# Patient Record
Sex: Female | Born: 1955
Health system: Southern US, Community
[De-identification: ages and names within clinical notes are randomized; demographics above are authoritative.]

## PROBLEM LIST (undated history)

## (undated) DIAGNOSIS — J189 Pneumonia, unspecified organism: Secondary | ICD-10-CM

## (undated) DIAGNOSIS — M1990 Unspecified osteoarthritis, unspecified site: Secondary | ICD-10-CM

## (undated) HISTORY — PX: AUGMENTATION MAMMAPLASTY: SUR837

## (undated) HISTORY — PX: ABDOMINAL HYSTERECTOMY: SHX81

## (undated) HISTORY — PX: PELVIC LAPAROSCOPY: SHX162

## (undated) HISTORY — DX: Unspecified osteoarthritis, unspecified site: M19.90

## (undated) HISTORY — PX: KNEE SURGERY: SHX244

## (undated) HISTORY — PX: ELBOW SURGERY: SHX618

---

## 1999-03-23 ENCOUNTER — Other Ambulatory Visit: Admission: RE | Admit: 1999-03-23 | Discharge: 1999-03-23 | Payer: Self-pay | Admitting: Gynecology

## 2000-08-31 ENCOUNTER — Other Ambulatory Visit: Admission: RE | Admit: 2000-08-31 | Discharge: 2000-08-31 | Payer: Self-pay | Admitting: Gynecology

## 2000-09-28 ENCOUNTER — Encounter: Payer: Self-pay | Admitting: Gynecology

## 2000-09-28 ENCOUNTER — Encounter: Admission: RE | Admit: 2000-09-28 | Discharge: 2000-09-28 | Payer: Self-pay | Admitting: Gynecology

## 2002-01-18 ENCOUNTER — Other Ambulatory Visit: Admission: RE | Admit: 2002-01-18 | Discharge: 2002-01-18 | Payer: Self-pay | Admitting: Gynecology

## 2002-01-19 ENCOUNTER — Encounter: Payer: Self-pay | Admitting: Gynecology

## 2002-01-19 ENCOUNTER — Encounter: Admission: RE | Admit: 2002-01-19 | Discharge: 2002-01-19 | Payer: Self-pay | Admitting: Gynecology

## 2002-09-03 ENCOUNTER — Ambulatory Visit (HOSPITAL_BASED_OUTPATIENT_CLINIC_OR_DEPARTMENT_OTHER): Admission: RE | Admit: 2002-09-03 | Discharge: 2002-09-03 | Payer: Self-pay | Admitting: Orthopedic Surgery

## 2003-03-06 ENCOUNTER — Encounter: Admission: RE | Admit: 2003-03-06 | Discharge: 2003-03-06 | Payer: Self-pay | Admitting: Gynecology

## 2003-03-06 ENCOUNTER — Encounter: Payer: Self-pay | Admitting: Gynecology

## 2003-03-25 ENCOUNTER — Other Ambulatory Visit: Admission: RE | Admit: 2003-03-25 | Discharge: 2003-03-25 | Payer: Self-pay | Admitting: Gynecology

## 2003-04-22 ENCOUNTER — Ambulatory Visit (HOSPITAL_BASED_OUTPATIENT_CLINIC_OR_DEPARTMENT_OTHER): Admission: RE | Admit: 2003-04-22 | Discharge: 2003-04-22 | Payer: Self-pay | Admitting: *Deleted

## 2004-03-27 ENCOUNTER — Other Ambulatory Visit: Admission: RE | Admit: 2004-03-27 | Discharge: 2004-03-27 | Payer: Self-pay | Admitting: Gynecology

## 2004-07-22 ENCOUNTER — Ambulatory Visit (HOSPITAL_COMMUNITY): Admission: RE | Admit: 2004-07-22 | Discharge: 2004-07-22 | Payer: Self-pay | Admitting: Gynecology

## 2004-07-22 ENCOUNTER — Ambulatory Visit (HOSPITAL_BASED_OUTPATIENT_CLINIC_OR_DEPARTMENT_OTHER): Admission: RE | Admit: 2004-07-22 | Discharge: 2004-07-22 | Payer: Self-pay | Admitting: Gynecology

## 2004-07-22 ENCOUNTER — Encounter (INDEPENDENT_AMBULATORY_CARE_PROVIDER_SITE_OTHER): Payer: Self-pay | Admitting: *Deleted

## 2004-09-01 ENCOUNTER — Other Ambulatory Visit: Admission: RE | Admit: 2004-09-01 | Discharge: 2004-09-01 | Payer: Self-pay | Admitting: Gynecology

## 2004-09-01 ENCOUNTER — Emergency Department (HOSPITAL_COMMUNITY): Admission: EM | Admit: 2004-09-01 | Discharge: 2004-09-02 | Payer: Self-pay | Admitting: Emergency Medicine

## 2004-11-15 HISTORY — PX: ENDOMETRIAL ABLATION: SHX621

## 2004-12-22 ENCOUNTER — Encounter: Admission: RE | Admit: 2004-12-22 | Discharge: 2004-12-22 | Payer: Self-pay | Admitting: Dermatology

## 2005-02-18 ENCOUNTER — Encounter: Admission: RE | Admit: 2005-02-18 | Discharge: 2005-02-18 | Payer: Self-pay | Admitting: Gynecology

## 2005-06-29 ENCOUNTER — Other Ambulatory Visit: Admission: RE | Admit: 2005-06-29 | Discharge: 2005-06-29 | Payer: Self-pay | Admitting: Gynecology

## 2006-03-18 ENCOUNTER — Other Ambulatory Visit: Admission: RE | Admit: 2006-03-18 | Discharge: 2006-03-18 | Payer: Self-pay | Admitting: Gynecology

## 2006-09-09 ENCOUNTER — Encounter: Admission: RE | Admit: 2006-09-09 | Discharge: 2006-09-09 | Payer: Self-pay | Admitting: Gynecology

## 2008-02-21 ENCOUNTER — Encounter: Admission: RE | Admit: 2008-02-21 | Discharge: 2008-02-21 | Payer: Self-pay | Admitting: Gynecology

## 2008-11-15 HISTORY — PX: KNEE SURGERY: SHX244

## 2009-03-21 ENCOUNTER — Ambulatory Visit: Payer: Self-pay | Admitting: Surgery

## 2009-03-22 ENCOUNTER — Encounter: Payer: Self-pay | Admitting: Surgery

## 2009-03-22 ENCOUNTER — Observation Stay (HOSPITAL_COMMUNITY): Admission: EM | Admit: 2009-03-22 | Discharge: 2009-03-23 | Payer: Self-pay | Admitting: *Deleted

## 2009-03-25 ENCOUNTER — Encounter: Admission: RE | Admit: 2009-03-25 | Discharge: 2009-03-25 | Payer: Self-pay | Admitting: Gynecology

## 2010-04-30 ENCOUNTER — Encounter: Admission: RE | Admit: 2010-04-30 | Discharge: 2010-04-30 | Payer: Self-pay | Admitting: Gynecology

## 2011-02-23 LAB — CBC
HCT: 29.9 % — ABNORMAL LOW (ref 36.0–46.0)
HCT: 32.5 % — ABNORMAL LOW (ref 36.0–46.0)
Hemoglobin: 10.4 g/dL — ABNORMAL LOW (ref 12.0–15.0)
Hemoglobin: 11.1 g/dL — ABNORMAL LOW (ref 12.0–15.0)
MCHC: 34.1 g/dL (ref 30.0–36.0)
MCHC: 34.8 g/dL (ref 30.0–36.0)
MCV: 95.8 fL (ref 78.0–100.0)
MCV: 96.3 fL (ref 78.0–100.0)
Platelets: 143 10*3/uL — ABNORMAL LOW (ref 150–400)
Platelets: 181 10*3/uL (ref 150–400)
RBC: 3.12 MIL/uL — ABNORMAL LOW (ref 3.87–5.11)
RBC: 3.37 MIL/uL — ABNORMAL LOW (ref 3.87–5.11)
RDW: 13.7 % (ref 11.5–15.5)
RDW: 13.8 % (ref 11.5–15.5)
WBC: 5.2 10*3/uL (ref 4.0–10.5)
WBC: 6.5 10*3/uL (ref 4.0–10.5)

## 2011-02-23 LAB — BASIC METABOLIC PANEL
BUN: 7 mg/dL (ref 6–23)
CO2: 27 mEq/L (ref 19–32)
Calcium: 8.1 mg/dL — ABNORMAL LOW (ref 8.4–10.5)
Chloride: 106 mEq/L (ref 96–112)
Creatinine, Ser: 0.84 mg/dL (ref 0.4–1.2)
GFR calc Af Amer: >60 mL/min (ref >60)
GFR calc non Af Amer: >60 mL/min (ref >60)
Glucose, Bld: 119 mg/dL — ABNORMAL HIGH (ref 70–99)
Potassium: 3.6 mEq/L (ref 3.5–5.1)
Sodium: 138 mEq/L (ref 135–145)

## 2011-02-23 LAB — POCT I-STAT, CHEM 8
BUN: 19 mg/dL (ref 6–23)
Calcium, Ion: 1.07 mmol/L — ABNORMAL LOW (ref 1.12–1.32)
Chloride: 109 mEq/L (ref 96–112)
Creatinine, Ser: 1.3 mg/dL — ABNORMAL HIGH (ref 0.4–1.2)
Glucose, Bld: 107 mg/dL — ABNORMAL HIGH (ref 70–99)
HCT: 33 % — ABNORMAL LOW (ref 36.0–46.0)
Hemoglobin: 11.2 g/dL — ABNORMAL LOW (ref 12.0–15.0)
Potassium: 3.4 mEq/L — ABNORMAL LOW (ref 3.5–5.1)
Sodium: 142 mEq/L (ref 135–145)
TCO2: 21 mmol/L (ref 0–100)

## 2011-02-23 LAB — SAMPLE TO BLOOD BANK

## 2011-02-23 LAB — ETHANOL: Alcohol, Ethyl (B): 138 mg/dL — ABNORMAL HIGH (ref 0–10)

## 2011-02-23 LAB — PROTIME-INR
INR: 1 (ref 0.00–1.49)
Prothrombin Time: 13.8 seconds (ref 11.6–15.2)

## 2011-04-02 ENCOUNTER — Other Ambulatory Visit: Payer: Self-pay | Admitting: Gynecology

## 2011-04-02 NOTE — Op Note (Signed)
NAME:  Brandy Henson, Brandy Henson                         ACCOUNT NO.:  [id number]   MEDICAL RECORD NO.:  [id number]                   PATIENT TYPE:  AMB   LOCATION:  DSC                                  FACILITY:  [facility]   PHYSICIAN:  Robert A. Thurston Hole, M.D.              DATE OF BIRTH:  1956-10-12   DATE OF PROCEDURE:  09/03/2002  DATE OF DISCHARGE:                                 OPERATIVE REPORT   PREOPERATIVE DIAGNOSES:  Right elbow lateral epicondylitis with partial  tear, extensor carpi radialis brevis tendon.   POSTOPERATIVE DIAGNOSES:  Right elbow lateral epicondylitis with partial  tear, extensor carpi radialis brevis tendon.   PROCEDURE:  Right elbow examination under anesthesia, followed by a lateral  retinacular release with partial lateral epicondylectomy, and repair of the  extensor carpi radialis brevis tendon.   SURGEON:  Elana Alm. Thurston Hole, M.D.   ASSISTANT:  Julien Girt, P.A.   ANESTHESIA:  General.   OPERATIVE TIME:  45 minutes.   COMPLICATIONS:  None.   INDICATIONS FOR PROCEDURE:  The patient is a 55 year old woman who has had  over one year of right elbow pain, increasing in nature, with signs and  symptoms consistent with lateral epicondylitis documented by an MRI with a  partial tear, who has failed conservative care and is now to undergo a  lateral epicondylar release, a partial epicondylectomy and a repair.   DESCRIPTION OF PROCEDURE:  The patient was brought to the operating room on  September 03, 2002, and placed on the operating room table in the supine  position.  After an adequate level of general anesthesia was obtained, her  right elbow was examined under anesthesia.  She had a full range of motion  and her elbow was stable to ligamentous examination.  The right arm was  prepped using sterile DuraPrep and draped using a sterile technique.  The  arm was exsanguinated and a tourniquet elevated to 250 mm.  Initially  through a 3.5 cm posterolateral  incision the initial exposure was made over  the lateral epicondyle.  The underlying subcutaneous tissues were incised in  line with the skin incision.  The fascia over the ECRB and ECRL tendon was  exposed.  The ECRB and ECRL tendons were detached from their lateral  epicondylar attachments and found to have significant tendinosis and partial  tearing, which was thoroughly debrided back to healthy tendon.  The  underlying lateral epicondyle had spurring in this region, which was removed  and resected with a rongeur.  The radial head capitellar joint was not  exposed.  Multiple drill holes were placed in the lateral epicondyle and a  #1 Panacryl suture was placed through these drill holes and through the ECRB  and ECRL tendon in a mattress technique, and then tied down over bone, thus  resecuring the tendon back down to the lateral epicondyle.  After this was  done, the elbow  could be brought through a full range of motion, with no  tension excessively on the repair.  The wound was then irrigated and then  the fascia closed over this with #2-0 Vicryl suture, and the subcutaneous  tissue closed with #2-0 Vicryl, the subcuticular layer closed with #3-0  Prolene.  Steri-Strips applied.  The wound was injected with 0.25% Marcaine.  Sterile dressings and a long-arm splint applied.  The tourniquet was  released.  The patient was awakened and taken to the recovery room in stable condition.   FOLLOW UP:  The patient will be followed as an outpatient on Vicodin and  Naprosyn.  I will see her back in the office in one week for a wound check  and followup.                                                Robert A. Thurston Hole, M.D.    RAW/MEDQ  D:  09/03/2002  T:  09/03/2002  Job:  [id number]

## 2011-04-02 NOTE — Op Note (Signed)
NAME:  Brandy Henson, Brandy Henson                         ACCOUNT NO.:  [id number]   MEDICAL RECORD NO.:  [id number]                   PATIENT TYPE:  AMB   LOCATION:  [facility]                                 FACILITY:  [facility]   PHYSICIAN:  Gretta Cool, M.D.              DATE OF BIRTH:  [birth year]   DATE OF PROCEDURE:  07/22/2004  DATE OF DISCHARGE:                                 OPERATIVE REPORT   PREOPERATIVE DIAGNOSES:  1.  Abnormal uterine bleeding unresponsive to conservative therapy.  2.  Uterine leiomyomata, subserosal.   POSTOPERATIVE DIAGNOSES:  1.  Abnormal uterine bleeding unresponsive to conservative therapy.  2.  Uterine leiomyomata, subserosal.   PROCEDURE:  Hysteroscopy with total endometrial resection for ablation.   SURGEON:  Gretta Cool, M.D.   ANESTHESIA:  IV sedation and paracervical block.   DESCRIPTION OF PROCEDURE:  Under excellent IV sedation, paracervical block  was performed and the cervix grasped with a single-tooth tenaculum.  It was  then progressively dilated with a series of Pratt dilators to accommodate a  7 mm resectoscope.  The cavity was then photographed and the pathology  documented.  At this point a very thickened ridge down the center of the  anterior uterine wall was noted and a very thickened posterior wall was  noted.  At this point there was no clear polyp formation in any area.  At  this point the endometrium was resected totally, extending 5 mm or so down  into the myometrium.  All resection was continued until gland openings were  no longer identified on the surface.  At this point the entire endometrial  cavity was treated with cautery using the loop to arc to the surface of the  entire endometrial cavity.  At the end of the procedure the bleeding was  minimal at reduced pressure.  The fluid deficit was approximately 190 mL.  Complications:  None.  The patient returned to the recovery room in  excellent condition without  complication.                                               Gretta Cool, M.D.    CWL/MEDQ  D:  07/22/2004  T:  07/22/2004  Job:  [id number]

## 2011-04-02 NOTE — Op Note (Signed)
NAME:  Brandy Henson, Brandy Henson                         ACCOUNT NO.:  [id number]   MEDICAL RECORD NO.:  [id number]                   PATIENT TYPE:  AMB   LOCATION:  DSC                                  FACILITY:  [facility]   PHYSICIAN:  Robert A. Thurston Hole, M.D.              DATE OF BIRTH:  08-Oct-1956   DATE OF PROCEDURE:  04/22/2003  DATE OF DISCHARGE:                                 OPERATIVE REPORT   PREOPERATIVE DIAGNOSES:  Left knee medial meniscus tear.   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   PROCEDURE:  1. Left examination under anesthesia performed by arthroscopic partial     medial meniscectomy.  2. Left knee patellofemoral chondroplasty.   SURGEON:  Elana Alm. Thurston Hole, M.D.   ASSISTANT:  Julien Girt, P.A.   ANESTHESIA:  Local.   OPERATIVE TIME:  30 minutes.   COMPLICATIONS:  None.   INDICATIONS FOR PROCEDURE:  The patient is a 55 year old woman who has had  significant left knee pain secondary to a twisting injury two months ago  playing softball.  Significant pain with examination and MRI documenting  medial meniscus tear.  She has failed conservative care and is now to  undergo arthroscopy.   DESCRIPTION:  The patient is brought to the operating room on April 22, 2003  after a knee block had been placed in the holding room.  She was placed on  operative table, supine position.  Her left knee was examined under  anesthesia.  She had full range of motion and her knee was stable to  ligamentous examination.  Left leg was prepped using sterile Duraprep and  draped using sterile technique.  She received Ancef 1 g IV preoperatively  for prophylaxis.  Initially, the arthroscopy was performed through an  inferolateral portal of the arthroscope with a pump attachment placed into  an inferomedial portal an arthroscopic probe was placed.  On initial  inspection medial compartment the articular cartilage was normal.   Medial  meniscus showed tearing of the posterior and medial horn which 40-50% was  resected back to a stable rim.  ACL/PCL was normal.  Lateral compartment  articular cartilage was normal.  Lateral meniscus normal.  Patellofemoral  joint showed 30-40% grade 3 chondromalacia on the patella which was  debrided.  Femoral groove articular cartilage was intact.  The patella  tracked normally.  Moderate synovitis in medial lateral gutters were  debrided.  Otherwise, they were free of pathology.  After this was done and  it was felt that all pathology had been satisfactorily addressed, the  instruments were removed.  The portal was closed with 3-0 nylon suture.  Sterile dressings were applied after the knee was injected with 0.25%  Marcaine with epinephrine and 4 mg of morphine.  The patient had been  awakened and taken to recovery room in stable condition.    FOLLOWUP CARE:  The  patient will be followed as an outpatient on Vicodin and  Naprosyn.  See back in office in week for sutures out and follow-up.                                               Robert A. Thurston Hole, M.D.    RAW/MEDQ  D:  04/22/2003  T:  04/22/2003  Job:  [id number]

## 2011-04-21 ENCOUNTER — Other Ambulatory Visit: Payer: Self-pay | Admitting: Gynecology

## 2011-04-26 ENCOUNTER — Other Ambulatory Visit: Payer: Self-pay | Admitting: Gynecology

## 2011-04-26 DIAGNOSIS — Z1231 Encounter for screening mammogram for malignant neoplasm of breast: Secondary | ICD-10-CM

## 2011-05-21 ENCOUNTER — Ambulatory Visit
Admission: RE | Admit: 2011-05-21 | Discharge: 2011-05-21 | Disposition: A | Discharge status: Home / Self Care | Payer: BC Managed Care – PPO | Source: Ambulatory Visit | Attending: Gynecology | Admitting: Gynecology

## 2011-05-21 DIAGNOSIS — Z1231 Encounter for screening mammogram for malignant neoplasm of breast: Secondary | ICD-10-CM

## 2012-04-03 ENCOUNTER — Other Ambulatory Visit: Payer: Self-pay | Admitting: Dermatology

## 2012-06-01 ENCOUNTER — Other Ambulatory Visit: Payer: Self-pay | Admitting: Gynecology

## 2012-06-01 DIAGNOSIS — Z1231 Encounter for screening mammogram for malignant neoplasm of breast: Secondary | ICD-10-CM

## 2012-06-06 ENCOUNTER — Ambulatory Visit
Admission: RE | Admit: 2012-06-06 | Discharge: 2012-06-06 | Disposition: A | Discharge status: Home / Self Care | Payer: BC Managed Care – PPO | Source: Ambulatory Visit | Attending: Gynecology | Admitting: Gynecology

## 2012-06-06 DIAGNOSIS — Z1231 Encounter for screening mammogram for malignant neoplasm of breast: Secondary | ICD-10-CM

## 2013-04-11 ENCOUNTER — Ambulatory Visit: Payer: Self-pay | Admitting: Gynecology

## 2013-04-17 ENCOUNTER — Ambulatory Visit (INDEPENDENT_AMBULATORY_CARE_PROVIDER_SITE_OTHER): Payer: BC Managed Care – PPO | Admitting: Gynecology

## 2013-04-17 ENCOUNTER — Encounter: Payer: Self-pay | Admitting: Gynecology

## 2013-04-17 VITALS — BP 110/66 | Ht 66.0 in | Wt 164.0 lb

## 2013-04-17 DIAGNOSIS — N949 Unspecified condition associated with female genital organs and menstrual cycle: Secondary | ICD-10-CM

## 2013-04-17 DIAGNOSIS — N938 Other specified abnormal uterine and vaginal bleeding: Secondary | ICD-10-CM

## 2013-04-17 DIAGNOSIS — Z79890 Hormone replacement therapy (postmenopausal): Secondary | ICD-10-CM

## 2013-04-17 DIAGNOSIS — N925 Other specified irregular menstruation: Secondary | ICD-10-CM

## 2013-04-17 DIAGNOSIS — Z13220 Encounter for screening for lipoid disorders: Secondary | ICD-10-CM

## 2013-04-17 DIAGNOSIS — Z131 Encounter for screening for diabetes mellitus: Secondary | ICD-10-CM

## 2013-04-17 DIAGNOSIS — Z01419 Encounter for gynecological examination (general) (routine) without abnormal findings: Secondary | ICD-10-CM

## 2013-04-17 LAB — CBC WITH DIFFERENTIAL/PLATELET
Basophils Absolute: 0.1 10*3/uL (ref 0.0–0.1)
Basophils Relative: 1 % (ref 0–1)
Eosinophils Absolute: 0.4 10*3/uL (ref 0.0–0.7)
Eosinophils Relative: 9 % — ABNORMAL HIGH (ref 0–5)
HCT: 39.1 % (ref 36.0–46.0)
Hemoglobin: 13 g/dL (ref 12.0–15.0)
Lymphocytes Relative: 32 % (ref 12–46)
Lymphs Abs: 1.4 10*3/uL (ref 0.7–4.0)
MCH: 30.4 pg (ref 26.0–34.0)
MCHC: 33.2 g/dL (ref 30.0–36.0)
MCV: 91.4 fL (ref 78.0–100.0)
Monocytes Absolute: 0.4 10*3/uL (ref 0.1–1.0)
Monocytes Relative: 10 % (ref 3–12)
Neutro Abs: 2.2 10*3/uL (ref 1.7–7.7)
Neutrophils Relative %: 48 % (ref 43–77)
Platelets: 201 10*3/uL (ref 150–400)
RBC: 4.28 MIL/uL (ref 3.87–5.11)
RDW: 13.6 % (ref 11.5–15.5)
WBC: 4.5 10*3/uL (ref 4.0–10.5)

## 2013-04-17 LAB — URINALYSIS W MICROSCOPIC + REFLEX CULTURE
Bacteria, UA: NONE SEEN
Bilirubin Urine: NEGATIVE
Casts: NONE SEEN
Crystals: NONE SEEN
Glucose, UA: NEGATIVE mg/dL
Hgb urine dipstick: NEGATIVE
Ketones, ur: NEGATIVE mg/dL
Leukocytes, UA: NEGATIVE
Nitrite: NEGATIVE
Protein, ur: NEGATIVE mg/dL
Specific Gravity, Urine: 1.021 (ref 1.005–1.030)
Urobilinogen, UA: 0.2 mg/dL (ref 0.0–1.0)
pH: 6.5 (ref 5.0–8.0)

## 2013-04-17 MED ORDER — PROGESTERONE MICRONIZED 100 MG PO CAPS: 100.0000 mg | ORAL_CAPSULE | Freq: Every day | ORAL | Status: DC

## 2013-04-17 MED ORDER — ESTRADIOL 0.075 MG/24HR TD PTTW: 1.0000 | MEDICATED_PATCH | TRANSDERMAL | Status: DC

## 2013-04-17 NOTE — Progress Notes (Signed)
Brandy Henson Nov 15, 1956 [id number]        57 y.o.  G4P4 for annual exam.  Former patient Dr. Nicholas Henson. Several issues noted below.  Past medical history,surgical history, medications, allergies, family history and social history were all reviewed and documented in the EPIC chart.  ROS:  12 system was performed and pertinent positives and negatives are included in the history, assessment and  plan .  Exam: Designer, multimedia Filed Vitals:   04/17/13 0906  BP: 110/66  Height: 5\' 6"  (1.676 m)  Weight: 164 lb (74.39 kg)   General appearance  Normal Skin grossly normal Head/Neck normal with no cervical or supraclavicular adenopathy thyroid normal Lungs  clear Cardiac RR, without RMG Abdominal  soft, nontender, without masses, organomegaly or hernia Breasts  examined lying and sitting without masses, retractions, discharge or axillary adenopathy.Bilateral implants and augmentation scars noted.  Pelvic  Ext/BUS/vagina  normal    Cervix  normal    Uterus  anteverted, normal size, shape and contour, midline and mobile nontender   Adnexa  Without masses or tenderness    Anus and perineum  normal   Rectovaginal  normal sphincter tone without palpated masses or tenderness.    Assessment/Plan:  57 y.o. G41P4 female for annual exam.   1. HRT. Patient on Minivelle 0.075 patches and Prometrium 200 mg first 12 days of each month. She will have occasional spotting at the end of the Prometrium. No bleeding in between.  Doing well from a symptom relief standpoint. Initially started in the early 50s for hot flashes and sweats. I reviewed the HRT issue with her to include the [facility] study with increased risk of stroke heart attack DVT and breast cancer. The ACOG and NAMS statements for lowest dose for shortest period of time reviewed. Transdermal benefit/first pass effect discussed. Possible benefits of continuing such as cardiovascular protection of bone health were also discussed. Options to wean versus  continuing reviewed and patient would prefer to continue. I did recommend switching to a continuous Prometrium 100 mg nightly and she agrees with this. I refilled both times one year. Patient relates having some form of biopsy by Dr. Nicholas Henson and in review of the records there is an endometrial biopsy listed under surgical pathology 04/2011 benign endocervical tissue but no endometrial tissue. This was indicated because of endometrial cells on Pap smear. Recommended baseline sonohysterogram now to look at endometrial echo and possible sample based on the echo. Patient agrees to schedule. 2. Pap smear 2013. No Pap smear done today. No history of significant abnormal Pap smears previously. Plan every 3 years screening per current screening guidelines. 3. Mammography July 2013. Recommend annual mammography and she'll schedule next month. SBE monthly reviewed. 4. Colonoscopy reported 2013 Dr. Kinnie Scales. Followup is recommended interval. 5. DEXA reported 2012 per Dr. Nicholas Henson and reported normal. Plan repeat at 62. Increase calcium vitamin D reviewed. 6. Cold sores. Patient uses Valtrex 1000 twice a day occasionally for cold sore outbreaks. Has plenty at home and will call if she needs refills. 7. Health maintenance. No recent routine blood work done. Baseline CBC comprehensive metabolic panel lipid profile vitamin D urinalysis ordered. Followup for sonohysterogram, sooner as needed.    [name] MD, 9:53 AM 04/17/2013

## 2013-04-17 NOTE — Patient Instructions (Signed)
Followup for ultrasound as scheduled. Otherwise followup in one year for annual exam. 

## 2013-04-18 ENCOUNTER — Telehealth: Payer: Self-pay | Admitting: *Deleted

## 2013-04-18 LAB — COMPREHENSIVE METABOLIC PANEL
ALT: 21 U/L (ref 0–35)
AST: 32 U/L (ref 0–37)
Albumin: 4 g/dL (ref 3.5–5.2)
Alkaline Phosphatase: 49 U/L (ref 39–117)
BUN: 22 mg/dL (ref 6–23)
CO2: 28 mEq/L (ref 19–32)
Calcium: 9 mg/dL (ref 8.4–10.5)
Chloride: 105 mEq/L (ref 96–112)
Creat: 0.9 mg/dL (ref 0.50–1.10)
Glucose, Bld: 77 mg/dL (ref 70–99)
Potassium: 4.1 mEq/L (ref 3.5–5.3)
Sodium: 140 mEq/L (ref 135–145)
Total Bilirubin: 0.5 mg/dL (ref 0.3–1.2)
Total Protein: 6.5 g/dL (ref 6.0–8.3)

## 2013-04-18 LAB — LIPID PANEL
Cholesterol: 171 mg/dL (ref 0–200)
HDL: 60 mg/dL (ref >39)
LDL Cholesterol: 95 mg/dL (ref 0–99)
Total CHOL/HDL Ratio: 2.9 Ratio
Triglycerides: 79 mg/dL (ref <150)
VLDL: 16 mg/dL (ref 0–40)

## 2013-04-18 LAB — VITAMIN D 25 HYDROXY (VIT D DEFICIENCY, FRACTURES): Vit D, 25-Hydroxy: 58 ng/mL (ref 30–89)

## 2013-04-18 LAB — TSH: TSH: 1.869 u[IU]/mL (ref 0.350–4.500)

## 2013-04-18 NOTE — Telephone Encounter (Signed)
She did start it now and forget about the 200 mg Prometrium.

## 2013-04-18 NOTE — Telephone Encounter (Signed)
Left the below note on pt voicemail. 

## 2013-04-18 NOTE — Telephone Encounter (Signed)
Pt was seen yesterday and given Rx for Prometrium 100 mg 1 po at bedtime. Pt said she has already started her old Rx for Prometrium 200 mg day 1-12 of the month from Dr. Nicholas Lose on 4th day of pills now. Pt asked when would you like her to start taking the 100 mg after taking the day 1-12? Next month? Please advise

## 2013-04-24 ENCOUNTER — Other Ambulatory Visit: Payer: Self-pay | Admitting: Gynecology

## 2013-04-24 DIAGNOSIS — N938 Other specified abnormal uterine and vaginal bleeding: Secondary | ICD-10-CM

## 2013-05-09 ENCOUNTER — Ambulatory Visit (INDEPENDENT_AMBULATORY_CARE_PROVIDER_SITE_OTHER): Payer: BC Managed Care – PPO

## 2013-05-09 ENCOUNTER — Other Ambulatory Visit: Payer: Self-pay

## 2013-05-09 ENCOUNTER — Ambulatory Visit (INDEPENDENT_AMBULATORY_CARE_PROVIDER_SITE_OTHER): Payer: BC Managed Care – PPO | Admitting: Gynecology

## 2013-05-09 ENCOUNTER — Encounter: Payer: Self-pay | Admitting: Gynecology

## 2013-05-09 DIAGNOSIS — D251 Intramural leiomyoma of uterus: Secondary | ICD-10-CM

## 2013-05-09 DIAGNOSIS — N949 Unspecified condition associated with female genital organs and menstrual cycle: Secondary | ICD-10-CM

## 2013-05-09 DIAGNOSIS — R8789 Other abnormal findings in specimens from female genital organs: Secondary | ICD-10-CM

## 2013-05-09 DIAGNOSIS — N83339 Acquired atrophy of ovary and fallopian tube, unspecified side: Secondary | ICD-10-CM

## 2013-05-09 DIAGNOSIS — N925 Other specified irregular menstruation: Secondary | ICD-10-CM

## 2013-05-09 DIAGNOSIS — D259 Leiomyoma of uterus, unspecified: Secondary | ICD-10-CM

## 2013-05-09 DIAGNOSIS — N938 Other specified abnormal uterine and vaginal bleeding: Secondary | ICD-10-CM

## 2013-05-09 DIAGNOSIS — D252 Subserosal leiomyoma of uterus: Secondary | ICD-10-CM

## 2013-05-09 DIAGNOSIS — N852 Hypertrophy of uterus: Secondary | ICD-10-CM

## 2013-05-09 DIAGNOSIS — R87618 Other abnormal cytological findings on specimens from cervix uteri: Secondary | ICD-10-CM

## 2013-05-09 DIAGNOSIS — Z1231 Encounter for screening mammogram for malignant neoplasm of breast: Secondary | ICD-10-CM

## 2013-05-09 NOTE — Patient Instructions (Signed)
Office will call you with biopsy results 

## 2013-05-09 NOTE — Progress Notes (Signed)
Patient presents for sonohysterogram. History of endometrial cells on Pap smear 2012. Followup endometrial biopsy by Dr. Nicholas Lose showed endocervical tissue no endometrial tissue. Subsequent Pap smear 2013 normal. She is on HRT as per prior note. He is status post endometrial ablation 2006.  Ultrasound shows uterus with large myoma 8 cm and a small 19 mm myoma. Endometrial echo 4.3 mm. Right and left ovaries atrophic in appearance. Cul-de-sac negative.  Procedure: Cervix was cleansed with Betadine and initial placement of the catheter was limited. Ultrasound showed that it was approximately midway up the endometrial cavity. I was unable to distend the cavity with pressure from the syringe and an endometrial sample was taken which was scant. Subsequently the cervix was grasped with a single-tooth tenaculum and with gentle pressure attempted sounding in dilatation and again was unable to pass further than previously. A separate endometrial sample was taken. Patient tolerated well.  Assessment and plan: Benign-appearing endometrial cells on Pap smear 2 years ago. Has had occasional withdrawal bleeding after progesterone on her HRT. Now has switched to a continuous daily progesterone. Discussed limited sample the patient secondary to her endometrial ablation. The initial echo was thin at 4.3 mm. Will await biopsy results. Suspect will return in adequate. If does show endometrial tissue and negative then we'll plan expectant management. If in adequate with no endometrial tissue options to monitor as long she does not further bleeding to watch versus more aggressive evaluation such as attempted dilatation intraoperative up to and including hysterectomy was discussed we both agree at this point not to pursue a more aggressive evaluation. The issue of missing an endometrial cancer was discussed.

## 2013-06-20 ENCOUNTER — Ambulatory Visit
Admission: RE | Admit: 2013-06-20 | Discharge: 2013-06-20 | Disposition: A | Discharge status: Home / Self Care | Payer: BC Managed Care – PPO | Source: Ambulatory Visit

## 2013-06-20 DIAGNOSIS — Z1231 Encounter for screening mammogram for malignant neoplasm of breast: Secondary | ICD-10-CM

## 2013-09-20 ENCOUNTER — Other Ambulatory Visit: Payer: Self-pay

## 2014-04-21 ENCOUNTER — Other Ambulatory Visit: Payer: Self-pay | Admitting: Gynecology

## 2014-05-06 ENCOUNTER — Other Ambulatory Visit: Payer: Self-pay | Admitting: Gynecology

## 2014-06-18 ENCOUNTER — Encounter: Payer: BC Managed Care – PPO | Admitting: Gynecology

## 2014-07-15 ENCOUNTER — Telehealth: Payer: Self-pay | Admitting: *Deleted

## 2014-07-15 DIAGNOSIS — Z01419 Encounter for gynecological examination (general) (routine) without abnormal findings: Secondary | ICD-10-CM

## 2014-07-15 NOTE — Telephone Encounter (Signed)
Pt has annual scheduled on 08/06/14 would like to have blood work done prior to annual. Please advise

## 2014-07-16 NOTE — Telephone Encounter (Signed)
CBC, comprehensive metabolic panel, lipid profile, TSH, vitamin D, urinalysis

## 2014-07-16 NOTE — Telephone Encounter (Signed)
Pt informed, orders placed, pt will be in 07/30/14 @ 9:00 am

## 2014-07-19 ENCOUNTER — Other Ambulatory Visit: Payer: Self-pay | Admitting: Gynecology

## 2014-07-30 ENCOUNTER — Other Ambulatory Visit: Payer: BC Managed Care – PPO

## 2014-07-30 DIAGNOSIS — Z01419 Encounter for gynecological examination (general) (routine) without abnormal findings: Secondary | ICD-10-CM

## 2014-07-30 LAB — COMPREHENSIVE METABOLIC PANEL WITH GFR
ALT: 24 U/L (ref 0–35)
AST: 26 U/L (ref 0–37)
Albumin: 4.1 g/dL (ref 3.5–5.2)
Alkaline Phosphatase: 45 U/L (ref 39–117)
BUN: 18 mg/dL (ref 6–23)
CO2: 27 meq/L (ref 19–32)
Calcium: 8.9 mg/dL (ref 8.4–10.5)
Chloride: 107 meq/L (ref 96–112)
Creat: 0.88 mg/dL (ref 0.50–1.10)
Glucose, Bld: 98 mg/dL (ref 70–99)
Potassium: 4 meq/L (ref 3.5–5.3)
Sodium: 139 meq/L (ref 135–145)
Total Bilirubin: 0.6 mg/dL (ref 0.2–1.2)
Total Protein: 6.6 g/dL (ref 6.0–8.3)

## 2014-07-30 LAB — URINALYSIS W MICROSCOPIC + REFLEX CULTURE
Bacteria, UA: NONE SEEN
Bilirubin Urine: NEGATIVE
Casts: NONE SEEN
Crystals: NONE SEEN
Glucose, UA: NEGATIVE mg/dL
Hgb urine dipstick: NEGATIVE
Ketones, ur: NEGATIVE mg/dL
Leukocytes, UA: NEGATIVE
Nitrite: NEGATIVE
Protein, ur: NEGATIVE mg/dL
Specific Gravity, Urine: 1.024 (ref 1.005–1.030)
Urobilinogen, UA: 0.2 mg/dL (ref 0.0–1.0)
pH: 5 (ref 5.0–8.0)

## 2014-07-30 LAB — LIPID PANEL
Cholesterol: 207 mg/dL — ABNORMAL HIGH (ref 0–200)
HDL: 71 mg/dL (ref >39)
LDL Cholesterol: 122 mg/dL — ABNORMAL HIGH (ref 0–99)
Total CHOL/HDL Ratio: 2.9 Ratio
Triglycerides: 72 mg/dL (ref <150)
VLDL: 14 mg/dL (ref 0–40)

## 2014-07-30 LAB — TSH: TSH: 1.904 u[IU]/mL (ref 0.350–4.500)

## 2014-07-31 ENCOUNTER — Other Ambulatory Visit: Payer: Self-pay | Admitting: Gynecology

## 2014-07-31 DIAGNOSIS — E7800 Pure hypercholesterolemia, unspecified: Secondary | ICD-10-CM

## 2014-07-31 LAB — CBC WITH DIFFERENTIAL/PLATELET
Basophils Absolute: 0 10*3/uL (ref 0.0–0.1)
Basophils Relative: 1 % (ref 0–1)
Eosinophils Absolute: 0.3 10*3/uL (ref 0.0–0.7)
Eosinophils Relative: 6 % — ABNORMAL HIGH (ref 0–5)
HCT: 39.8 % (ref 36.0–46.0)
Hemoglobin: 13.3 g/dL (ref 12.0–15.0)
Lymphocytes Relative: 30 % (ref 12–46)
Lymphs Abs: 1.3 10*3/uL (ref 0.7–4.0)
MCH: 30.9 pg (ref 26.0–34.0)
MCHC: 33.4 g/dL (ref 30.0–36.0)
MCV: 92.6 fL (ref 78.0–100.0)
Monocytes Absolute: 0.3 10*3/uL (ref 0.1–1.0)
Monocytes Relative: 8 % (ref 3–12)
Neutro Abs: 2.4 10*3/uL (ref 1.7–7.7)
Neutrophils Relative %: 55 % (ref 43–77)
Platelets: 213 10*3/uL (ref 150–400)
RBC: 4.3 MIL/uL (ref 3.87–5.11)
RDW: 14.2 % (ref 11.5–15.5)
WBC: 4.3 10*3/uL (ref 4.0–10.5)

## 2014-07-31 LAB — VITAMIN D 25 HYDROXY (VIT D DEFICIENCY, FRACTURES): Vit D, 25-Hydroxy: 54 ng/mL (ref 30–89)

## 2014-08-02 ENCOUNTER — Other Ambulatory Visit: Payer: BC Managed Care – PPO

## 2014-08-02 DIAGNOSIS — E7800 Pure hypercholesterolemia, unspecified: Secondary | ICD-10-CM

## 2014-08-02 LAB — LIPID PANEL
Cholesterol: 199 mg/dL (ref 0–200)
HDL: 73 mg/dL (ref >39)
LDL Cholesterol: 115 mg/dL — ABNORMAL HIGH (ref 0–99)
Total CHOL/HDL Ratio: 2.7 Ratio
Triglycerides: 56 mg/dL (ref <150)
VLDL: 11 mg/dL (ref 0–40)

## 2014-08-06 ENCOUNTER — Other Ambulatory Visit (HOSPITAL_COMMUNITY)
Admission: RE | Admit: 2014-08-06 | Discharge: 2014-08-06 | Disposition: A | Discharge status: Home / Self Care | Payer: BC Managed Care – PPO | Source: Ambulatory Visit | Attending: Gynecology | Admitting: Gynecology

## 2014-08-06 ENCOUNTER — Encounter: Payer: Self-pay | Admitting: Gynecology

## 2014-08-06 ENCOUNTER — Ambulatory Visit (INDEPENDENT_AMBULATORY_CARE_PROVIDER_SITE_OTHER): Payer: BC Managed Care – PPO | Admitting: Gynecology

## 2014-08-06 VITALS — BP 112/64 | Ht 66.5 in | Wt 162.0 lb

## 2014-08-06 DIAGNOSIS — N952 Postmenopausal atrophic vaginitis: Secondary | ICD-10-CM

## 2014-08-06 DIAGNOSIS — Z79890 Hormone replacement therapy (postmenopausal): Secondary | ICD-10-CM

## 2014-08-06 DIAGNOSIS — Z01419 Encounter for gynecological examination (general) (routine) without abnormal findings: Secondary | ICD-10-CM | POA: Diagnosis present

## 2014-08-06 DIAGNOSIS — D251 Intramural leiomyoma of uterus: Secondary | ICD-10-CM

## 2014-08-06 MED ORDER — PROGESTERONE MICRONIZED 100 MG PO CAPS: ORAL_CAPSULE | ORAL | Status: DC

## 2014-08-06 MED ORDER — ZOLPIDEM TARTRATE 10 MG PO TABS: 10.0000 mg | ORAL_TABLET | Freq: Every evening | ORAL | Status: DC | PRN

## 2014-08-06 MED ORDER — ESTRADIOL 0.075 MG/24HR TD PTTW: MEDICATED_PATCH | TRANSDERMAL | Status: DC

## 2014-08-06 NOTE — Progress Notes (Signed)
Brandy Henson [birth year] [id number]        58 y.o.  G4P4 for annual exam.  Several issues noted below.  Past medical history,surgical history, problem list, medications, allergies, family history and social history were all reviewed and documented as reviewed in the EPIC chart.  ROS:  12 system ROS performed with pertinent positives and negatives included in the history, assessment and plan.   Additional significant findings :  none   Exam: Kim Counsellor Vitals:   08/06/14 0918  BP: 112/64  Height: 5' 6.5" (1.689 m)  Weight: 162 lb (73.483 kg)   General appearance:  Normal affect, orientation and appearance. Skin: Grossly normal HEENT: Without gross lesions.  No cervical or supraclavicular adenopathy. Thyroid normal.  Lungs:  Clear without wheezing, rales or rhonchi Cardiac: RR, without RMG Abdominal:  Soft, nontender, without masses, guarding, rebound, organomegaly or hernia Breasts:  Examined lying and sitting without masses, retractions, discharge or axillary adenopathy.  Bilateral implants noted Pelvic:  Ext/BUS/vagina normal  Cervix normal  Bimanual with large 10 cm cystic feeling mass filling the pelvis. Nontender  Anus and perineum  Normal   Rectovaginal  Normal sphincter tone without palpated masses or tenderness.    Assessment/Plan:  58 y.o. G37P4 female for annual exam.   1. Cystic feeling pelvic mass.  She does have a history of leiomyoma 8 cm on ultrasound from last year. Right and left ovaries appeared atrophic. Her pelvic felt normal last year. Recommend ultrasound now for better definition of anatomy and she will follow up for this with an appointment to see me afterwards. 2. HRT.  Continues on MiniVivelle 0.075 patches and Prometrium 100 mg nightly.  Doing well and wants to continue. I again reviewed the issues with HRT in the [facility] study with increased risk of stroke heart attack DVT and breast cancer. ACOG and NAMS statements for lowest dose for shortest  period of time reviewed. She has done no bleeding. I refilled her x1 year. Call if any bleeding. 3. Pap smear 2013. Pap smear done today. History of benign endometrial cells previously with follow up endometrial biopsy showing atrophic endometrium. 4. Mammography 06/2013. Follow up for a mammogram now. SBE monthly reviewed. 5. Colonoscopy 2012.  Follow up at their recommended interval. 6. DEXA 2012 reported normal per Dr. Ubaldo Glassing.  Repeat at age 42. Increased calcium vitamin D reviewed.  Recent vitamin D level 54 7. Herpes labialis.  Uses Valtrex 1000 mg twice a day as needed. Has supply at home. We'll: Needs more. 8. Insomnia.  Having a stressful time with illnesses in the family and son leaving for college. Feels that if she could break the cycle she would do well. Ambien 10 mg #30 one refill provided. 9. Health maintenance.  Recent baseline labs normal with the exception of a mildly elevated LDL of 115.  Planning on increasing her exercise with dietary changes. We'll follow at present and repeat at 1 year. Follow up for ultrasound.     Anastasio Auerbach MD, 9:59 AM 08/06/2014

## 2014-08-06 NOTE — Patient Instructions (Signed)
Follow up for ultrasound as scheduled.  You may obtain a copy of any labs that were done today by logging onto MyChart as outlined in the instructions provided with your AVS (after visit summary). The office will not call with normal lab results but certainly if there are any significant abnormalities then we will contact you.   Health Maintenance, Female A healthy lifestyle and preventative care can promote health and wellness.  Maintain regular health, dental, and eye exams.  Eat a healthy diet. Foods like vegetables, fruits, whole grains, low-fat dairy products, and lean protein foods contain the nutrients you need without too many calories. Decrease your intake of foods high in solid fats, added sugars, and salt. Get information about a proper diet from your caregiver, if necessary.  Regular physical exercise is one of the most important things you can do for your health. Most adults should get at least 150 minutes of moderate-intensity exercise (any activity that increases your heart rate and causes you to sweat) each week. In addition, most adults need muscle-strengthening exercises on 2 or more days a week.   Maintain a healthy weight. The body mass index (BMI) is a screening tool to identify possible weight problems. It provides an estimate of body fat based on height and weight. Your caregiver can help determine your BMI, and can help you achieve or maintain a healthy weight. For adults 20 years and older:  A BMI below 18.5 is considered underweight.  A BMI of 18.5 to 24.9 is normal.  A BMI of 25 to 29.9 is considered overweight.  A BMI of 30 and above is considered obese.  Maintain normal blood lipids and cholesterol by exercising and minimizing your intake of saturated fat. Eat a balanced diet with plenty of fruits and vegetables. Blood tests for lipids and cholesterol should begin at age 20 and be repeated every 5 years. If your lipid or cholesterol levels are high, you are over  50, or you are a high risk for heart disease, you may need your cholesterol levels checked more frequently.Ongoing high lipid and cholesterol levels should be treated with medicines if diet and exercise are not effective.  If you smoke, find out from your caregiver how to quit. If you do not use tobacco, do not start.  Lung cancer screening is recommended for adults aged 55 80 years who are at high risk for developing lung cancer because of a history of smoking. Yearly low-dose computed tomography (CT) is recommended for people who have at least a 30-pack-year history of smoking and are a current smoker or have quit within the past 15 years. A pack year of smoking is smoking an average of 1 pack of cigarettes a day for 1 year (for example: 1 pack a day for 30 years or 2 packs a day for 15 years). Yearly screening should continue until the smoker has stopped smoking for at least 15 years. Yearly screening should also be stopped for people who develop a health problem that would prevent them from having lung cancer treatment.  If you are pregnant, do not drink alcohol. If you are breastfeeding, be very cautious about drinking alcohol. If you are not pregnant and choose to drink alcohol, do not exceed 1 drink per day. One drink is considered to be 12 ounces (355 mL) of beer, 5 ounces (148 mL) of wine, or 1.5 ounces (44 mL) of liquor.  Avoid use of street drugs. Do not share needles with anyone. Ask for help if   if you need support or instructions about stopping the use of drugs.  High blood pressure causes heart disease and increases the risk of stroke. Blood pressure should be checked at least every 1 to 2 years. Ongoing high blood pressure should be treated with medicines, if weight loss and exercise are not effective.  If you are 55 to 56 years old, ask your caregiver if you should take aspirin to prevent strokes.  Diabetes screening involves taking a blood sample to check your fasting blood sugar level.  This should be done once every 3 years, after age 45, if you are within normal weight and without risk factors for diabetes. Testing should be considered at a younger age or be carried out more frequently if you are overweight and have at least 1 risk factor for diabetes.  Breast cancer screening is essential preventative care for women. You should practice "breast self-awareness." This means understanding the normal appearance and feel of your breasts and may include breast self-examination. Any changes detected, no matter how small, should be reported to a caregiver. Women in their 20s and 30s should have a clinical breast exam (CBE) by a caregiver as part of a regular health exam every 1 to 3 years. After age 40, women should have a CBE every year. Starting at age 40, women should consider having a mammogram (breast X-ray) every year. Women who have a family history of breast cancer should talk to their caregiver about genetic screening. Women at a high risk of breast cancer should talk to their caregiver about having an MRI and a mammogram every year.  Breast cancer gene (BRCA)-related cancer risk assessment is recommended for women who have family members with BRCA-related cancers. BRCA-related cancers include breast, ovarian, tubal, and peritoneal cancers. Having family members with these cancers may be associated with an increased risk for harmful changes (mutations) in the breast cancer genes BRCA1 and BRCA2. Results of the assessment will determine the need for genetic counseling and BRCA1 and BRCA2 testing.  The Pap test is a screening test for cervical cancer. Women should have a Pap test starting at age 21. Between ages 21 and 29, Pap tests should be repeated every 2 years. Beginning at age 30, you should have a Pap test every 3 years as long as the past 3 Pap tests have been normal. If you had a hysterectomy for a problem that was not cancer or a condition that could lead to cancer, then you no  longer need Pap tests. If you are between ages 65 and 70, and you have had normal Pap tests going back 10 years, you no longer need Pap tests. If you have had past treatment for cervical cancer or a condition that could lead to cancer, you need Pap tests and screening for cancer for at least 20 years after your treatment. If Pap tests have been discontinued, risk factors (such as a new sexual partner) need to be reassessed to determine if screening should be resumed. Some women have medical problems that increase the chance of getting cervical cancer. In these cases, your caregiver may recommend more frequent screening and Pap tests.  The human papillomavirus (HPV) test is an additional test that may be used for cervical cancer screening. The HPV test looks for the virus that can cause the cell changes on the cervix. The cells collected during the Pap test can be tested for HPV. The HPV test could be used to screen women aged 30 years and older, and   be used in women of any age who have unclear Pap test results. After the age of 63, women should have HPV testing at the same frequency as a Pap test.  Colorectal cancer can be detected and often prevented. Most routine colorectal cancer screening begins at the age of 67 and continues through age 18. However, your caregiver Emel recommend screening at an earlier age if you have risk factors for colon cancer. On a yearly basis, your caregiver Vila provide home test kits to check for hidden blood in the stool. Use of a small camera at the end of a tube, to directly examine the colon (sigmoidoscopy or colonoscopy), can detect the earliest forms of colorectal cancer. Talk to your caregiver about this at age 65, when routine screening begins. Direct examination of the colon should be repeated every 5 to 10 years through age 70, unless early forms of pre-cancerous polyps or small growths are found.  Hepatitis C blood testing is recommended for all people born from  [birth year] through 1963 and any individual with known risks for hepatitis C.  Practice safe sex. Use condoms and avoid high-risk sexual practices to reduce the spread of sexually transmitted infections (STIs). Sexually active women aged 26 and younger should be checked for Chlamydia, which is a common sexually transmitted infection. Older women with new or multiple partners should also be tested for Chlamydia. Testing for other STIs is recommended if you are sexually active and at increased risk.  Osteoporosis is a disease in which the bones lose minerals and strength with aging. This can result in serious bone fractures. The risk of osteoporosis can be identified using a bone density scan. Women ages 9 and over and women at risk for fractures or osteoporosis should discuss screening with their caregivers. Ask your caregiver whether you should be taking a calcium supplement or vitamin D to reduce the rate of osteoporosis.  Menopause can be associated with physical symptoms and risks. Hormone replacement therapy is available to decrease symptoms and risks. You should talk to your caregiver about whether hormone replacement therapy is right for you.  Use sunscreen. Apply sunscreen liberally and repeatedly throughout the day. You should seek shade when your shadow is shorter than you. Protect yourself by wearing long sleeves, pants, a wide-brimmed hat, and sunglasses year round, whenever you are outdoors.  Notify your caregiver of new moles or changes in moles, especially if there is a change in shape or color. Also notify your caregiver if a mole is larger than the size of a pencil eraser.  Stay current with your immunizations. Document Released: 05/17/2011 Document Revised: 02/26/2013 Document Reviewed: 05/17/2011 [facility] Patient Information 2014 [employer].

## 2014-08-07 LAB — CYTOLOGY - PAP

## 2014-08-09 ENCOUNTER — Ambulatory Visit (INDEPENDENT_AMBULATORY_CARE_PROVIDER_SITE_OTHER): Payer: BC Managed Care – PPO

## 2014-08-09 ENCOUNTER — Ambulatory Visit (INDEPENDENT_AMBULATORY_CARE_PROVIDER_SITE_OTHER): Payer: BC Managed Care – PPO | Admitting: Gynecology

## 2014-08-09 ENCOUNTER — Other Ambulatory Visit: Payer: Self-pay | Admitting: Gynecology

## 2014-08-09 ENCOUNTER — Encounter: Payer: Self-pay | Admitting: Gynecology

## 2014-08-09 DIAGNOSIS — N852 Hypertrophy of uterus: Secondary | ICD-10-CM

## 2014-08-09 DIAGNOSIS — D251 Intramural leiomyoma of uterus: Secondary | ICD-10-CM

## 2014-08-09 DIAGNOSIS — Z803 Family history of malignant neoplasm of breast: Secondary | ICD-10-CM

## 2014-08-09 DIAGNOSIS — R1032 Left lower quadrant pain: Secondary | ICD-10-CM

## 2014-08-09 NOTE — Patient Instructions (Signed)
Followup in one year for annual exam, sooner if any issues 

## 2014-08-09 NOTE — Progress Notes (Signed)
EVELINE SAUVE 1956-07-28 [id number]        58 y.o.  G4P4 Presents for ultrasound due to a pelvic mass felt that recent annual exam. She is known to have had large leiomyoma in the past.  Past medical history,surgical history, problem list, medications, allergies, family history and social history were all reviewed and documented in the EPIC chart.  Directed ROS with pertinent positives and negatives documented in the history of present illness/assessment and plan.  Ultrasound shows uterus with 2 myomas the first 53 mm left fundus and the second 34 mm right fundus. Endometrial echo 4.7 mm Right ovary is normal. Left ovary not well visualized but no adnexal masses noted. Cul-de-sac negative.  Exam: Kim assistant General appearance:  Normal Abdomen soft nontender without masses guarding rebound Pelvic external BUS vagina normal. Cervix normal. Uterus bulky consistent with ultrasound findings, nontender. Adnexa without gross masses.  Assessment/Plan:  58 y.o. G4P4 with perceived pelvic mass on reexamination consistent with the ultrasound findings combining the 2 leiomyoma. Does not feel cystic as before but firm again consistent with her leiomyoma. Plan expectant management with reexamination at her next annual exam.     Anastasio Auerbach MD, 10:07 AM 08/09/2014

## 2014-08-21 ENCOUNTER — Other Ambulatory Visit: Payer: Self-pay

## 2014-08-21 DIAGNOSIS — Z1239 Encounter for other screening for malignant neoplasm of breast: Secondary | ICD-10-CM

## 2014-08-23 ENCOUNTER — Encounter: Payer: Self-pay | Admitting: Gynecology

## 2014-08-23 ENCOUNTER — Ambulatory Visit (INDEPENDENT_AMBULATORY_CARE_PROVIDER_SITE_OTHER): Payer: BC Managed Care – PPO | Admitting: Gynecology

## 2014-08-23 DIAGNOSIS — F4323 Adjustment disorder with mixed anxiety and depressed mood: Secondary | ICD-10-CM

## 2014-08-23 NOTE — Patient Instructions (Signed)
Office will contact you to arrange follow up with a mental health provider.

## 2014-08-23 NOTE — Progress Notes (Signed)
Brandy Henson 1956/07/07 [id number]        58 y.o.  G4P4 Patient originally presented complaining of urinary retention. On questioning the issue is anxiety. She has a long history of anxiety that she relates to dealing with her elderly parents and her father's death several years ago. She saw Sheralyn Boatman previously and had been treated with medication to include Lamictal. She improved and subsequently stopped the medication and had been doing well but most recently had a trip to deal with her elderly mother and feels that all of these motions are coming back to her now. She's having a hard time relaxing, sleeping and dealing with daily stressors. The complaint of urinary retention is a chronic issue that she said is just that she can't relax and then has to sit for a while until she can void. Recent urinalysis was negative. She states she cannot void today and the issue of catheterization versus observation reviewed. Patient prefers just observation again saying this is a chronic issue and she thinks is related to her nerves. She is having no frequency dysuria fever chills or other urinary symptoms.  Past medical history,surgical history, problem list, medications, allergies, family history and social history were all reviewed and documented in the EPIC chart.  Directed ROS with pertinent positives and negatives documented in the history of present illness/assessment and plan.  Exam: Brandy Henson General appearance:  Normal Abdomen soft nontender without masses guarding rebound Pelvic external BUS vagina normal. Cervix normal.  Uterus bulky consistent with her leiomyoma. Adnexa without masses or tenderness  Assessment/Plan:  58 y.o. G4P4 with anxiety questionable posttraumatic stress type symptoms. Recommended follow up with mental health provider. Patient did not want to go back to see Dr. Caprice Beaver asked by could refer her elsewhere and we will refer her through [facility]. She is  able to function daily and not having any acute issues such as suicide ideation related to her anxiety.     Brandy Auerbach MD, 9:50 AM 08/23/2014

## 2014-08-26 ENCOUNTER — Telehealth: Payer: Self-pay | Admitting: *Deleted

## 2014-08-26 NOTE — Telephone Encounter (Signed)
Message copied by Thamas Jaegers on Mon Aug 26, 2014  8:50 AM ------      Message from: Anastasio Auerbach      Created: Fri Aug 23, 2014  9:54 AM       Referral to [facility] reference anxiety ------

## 2014-08-26 NOTE — Telephone Encounter (Signed)
Number given to pt for Marya Amsler for her to call and schedule appointment.

## 2014-09-03 ENCOUNTER — Other Ambulatory Visit: Payer: Self-pay

## 2014-09-03 DIAGNOSIS — Z1231 Encounter for screening mammogram for malignant neoplasm of breast: Secondary | ICD-10-CM

## 2014-09-04 ENCOUNTER — Ambulatory Visit
Admission: RE | Admit: 2014-09-04 | Discharge: 2014-09-04 | Disposition: A | Discharge status: Home / Self Care | Payer: BC Managed Care – PPO | Source: Ambulatory Visit

## 2014-09-04 DIAGNOSIS — Z1231 Encounter for screening mammogram for malignant neoplasm of breast: Secondary | ICD-10-CM

## 2014-09-09 ENCOUNTER — Ambulatory Visit (INDEPENDENT_AMBULATORY_CARE_PROVIDER_SITE_OTHER): Payer: BC Managed Care – PPO | Admitting: Licensed Clinical Social Worker

## 2014-09-09 DIAGNOSIS — F332 Major depressive disorder, recurrent severe without psychotic features: Secondary | ICD-10-CM

## 2014-09-16 ENCOUNTER — Encounter: Payer: Self-pay | Admitting: Gynecology

## 2014-09-17 ENCOUNTER — Telehealth: Payer: Self-pay | Admitting: *Deleted

## 2014-09-17 NOTE — Telephone Encounter (Signed)
Pt had her mammogram done for this year and noticed that the report  in 06/2013 noted heterogeneously dense breast and the report for this year did not. Pt asked if this normal to have one report noting dense breast and the other not? Please advise

## 2014-09-17 NOTE — Telephone Encounter (Signed)
Left the below on pt voicemail. 

## 2014-09-17 NOTE — Telephone Encounter (Signed)
Not unusual for breasts to become less dense as patients transition through the menopause

## 2014-09-20 ENCOUNTER — Ambulatory Visit: Payer: BC Managed Care – PPO | Admitting: Licensed Clinical Social Worker

## 2014-09-20 ENCOUNTER — Ambulatory Visit (INDEPENDENT_AMBULATORY_CARE_PROVIDER_SITE_OTHER): Payer: BC Managed Care – PPO | Admitting: Licensed Clinical Social Worker

## 2014-09-20 DIAGNOSIS — F332 Major depressive disorder, recurrent severe without psychotic features: Secondary | ICD-10-CM

## 2014-09-27 ENCOUNTER — Ambulatory Visit (INDEPENDENT_AMBULATORY_CARE_PROVIDER_SITE_OTHER): Payer: BC Managed Care – PPO | Admitting: Licensed Clinical Social Worker

## 2014-09-27 DIAGNOSIS — F332 Major depressive disorder, recurrent severe without psychotic features: Secondary | ICD-10-CM

## 2014-10-09 ENCOUNTER — Ambulatory Visit (INDEPENDENT_AMBULATORY_CARE_PROVIDER_SITE_OTHER): Payer: BC Managed Care – PPO | Admitting: Licensed Clinical Social Worker

## 2014-10-09 DIAGNOSIS — F332 Major depressive disorder, recurrent severe without psychotic features: Secondary | ICD-10-CM

## 2014-10-30 ENCOUNTER — Ambulatory Visit (INDEPENDENT_AMBULATORY_CARE_PROVIDER_SITE_OTHER): Payer: BC Managed Care – PPO | Admitting: Licensed Clinical Social Worker

## 2014-10-30 DIAGNOSIS — F332 Major depressive disorder, recurrent severe without psychotic features: Secondary | ICD-10-CM

## 2014-10-31 ENCOUNTER — Telehealth: Payer: Self-pay | Admitting: *Deleted

## 2014-10-31 NOTE — Telephone Encounter (Signed)
Unfortunately that can be a side effect of a lot of antidepressants. One option would be to try a different antidepressant. I'll be glad to talk to her about that if she wants to come in. Or have the prescribing physician consider switching to a different.

## 2014-10-31 NOTE — Telephone Encounter (Signed)
Pt was prescribed Celexa 20 mg by a PCP she seen recently to a trial of this Rx for 6 months. Pt said medication is working great for depression/anxiety, but her sex drive is zero. She spoke with the prescribing physician about this and he didn't address this. Pt saw Marya Amsler yesterday and told her about this as well and she recommended a follow up with you. Pt asked if you have any recommendation to help with this issue? Medication to help increase sex drive? Should patient make consult OV to discuss? Please advise

## 2014-11-01 NOTE — Telephone Encounter (Signed)
Pt informed with the below note. 

## 2014-11-13 ENCOUNTER — Ambulatory Visit (INDEPENDENT_AMBULATORY_CARE_PROVIDER_SITE_OTHER): Payer: BC Managed Care – PPO | Admitting: Gynecology

## 2014-11-13 ENCOUNTER — Encounter: Payer: Self-pay | Admitting: Gynecology

## 2014-11-13 VITALS — BP 134/82 | Ht 66.0 in | Wt 164.0 lb

## 2014-11-13 DIAGNOSIS — R6882 Decreased libido: Secondary | ICD-10-CM

## 2014-11-13 NOTE — Progress Notes (Signed)
Brandy Henson 17-May-1956 [id number]        58 y.o.  G4P4 Presents to discuss difficulty achieving orgasm.  Patient was recently started on citalopram 20 mg for anxiety/depressive symptoms. Had a great response with stabilization of her mood and now she feels normal again. She does note though she now has difficulty achieving orgasm. States that her desire remains but that she has a difficulty achieving orgasm. Discussed this with her primary physician who prescribed the medication and she just recently decreased her dose to 10 mg a week ago. Notes it when she did skip a whole day she had more of a return in her normal functioning. Her primary physician suggested that she discuss this with me.  Past medical history,surgical history, problem list, medications, allergies, family history and social history were all reviewed and documented in the EPIC chart.  Directed ROS with pertinent positives and negatives documented in the history of present illness/assessment and plan.   Assessment/Plan:  58 y.o. G4P4 with difficulty achieving orgasm after starting on citalopram 20 mg. Recently decreased the dose to 10 mg. Recommended she wait several weeks to see if she does not have return of normal sexual functioning. I did not suggest that she stop her medication since she has had such a great response from a mood stabilization standpoint. Alternatives would be to try a different medication which she can discuss with her primary physician who is monitoring the other. Possible addition of testosterone either in pill form or cream form to see if this does not help. As it is not a desire issue I do not feel that Addyi appropriate. I did review the side effects of testosterone to include lipid profile alterations, hair growth, acne and weight gain. Will tentatively plan on testosterone cream applied nightly periclitoral he to see if this does not help. But before starting this will wait several weeks to see if decreasing  the citalopram dose does not help. Patient will call me if she is continuing to have an issue.     Anastasio Auerbach MD, 1:49 PM 11/13/2014

## 2014-11-13 NOTE — Patient Instructions (Signed)
Call me in several weeks if your situation remains unchanged.

## 2015-04-10 ENCOUNTER — Other Ambulatory Visit: Payer: Self-pay | Admitting: Gynecology

## 2015-04-10 NOTE — Telephone Encounter (Signed)
Per note on 08/06/14 "Insomnia. Having a stressful time with illnesses in the family and son leaving for college. Feels that if she could break the cycle she would do well." okay refill?

## 2015-04-11 NOTE — Telephone Encounter (Signed)
Rx called in 

## 2015-04-30 IMAGING — MG MM SCREENING BREAST W/IMPLANT TOMO BILATERAL
9 of 12 series · 9 of 28 positions shown · non-contrast
Comparison: Previous exam(s)

CLINICAL DATA: Screening.

EXAM:
DIGITAL SCREENING BILATERAL MAMMOGRAM WITH IMPLANTS, 3D TOMO WITH
CAD
The patient has retropectoral implants. Standard and implant
displaced views were performed.

[R MLO (1 of 2)]
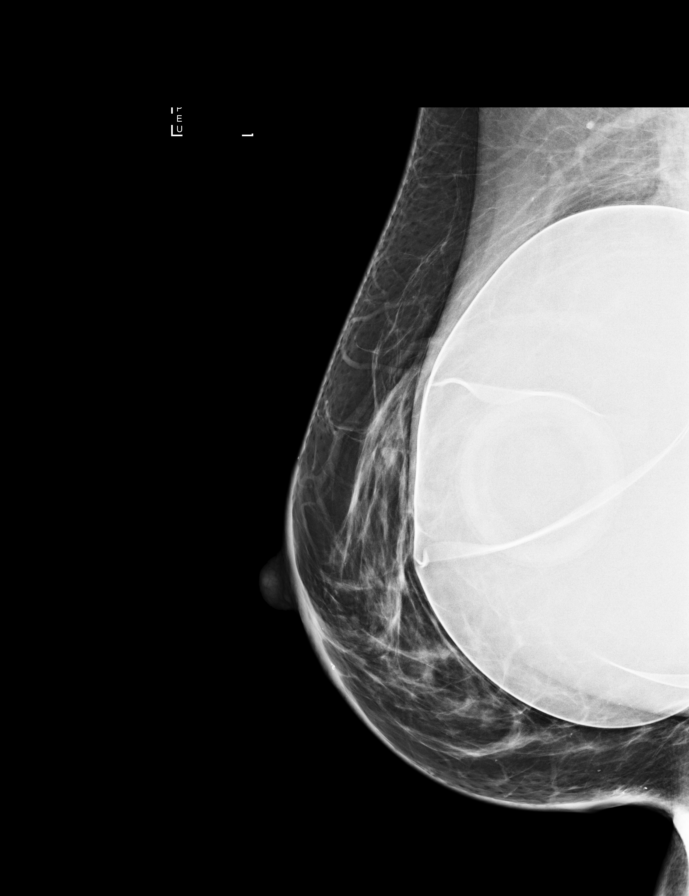

[R CC (1 of 2)]
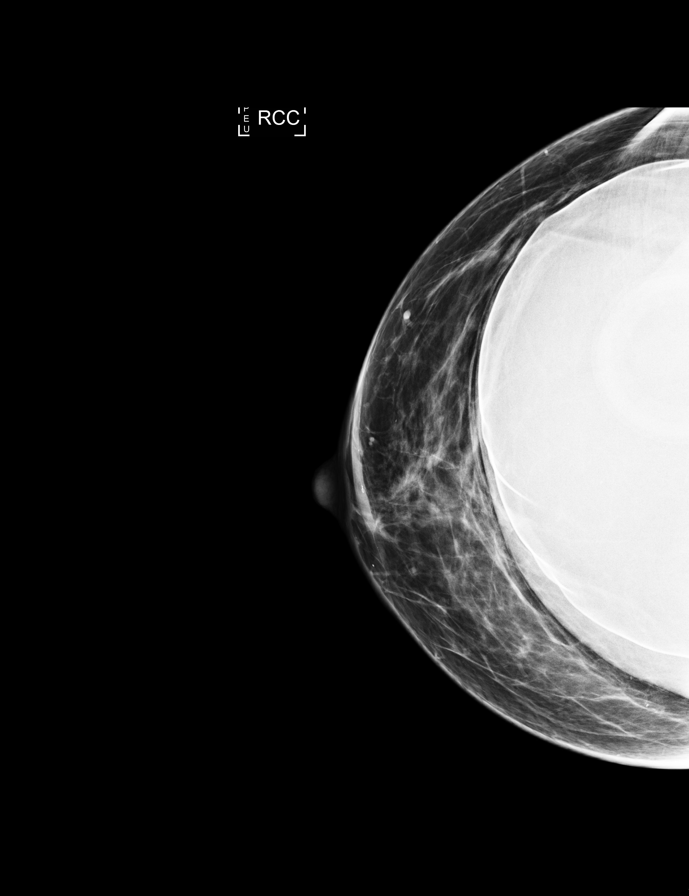

[L MLO (1 of 2)]
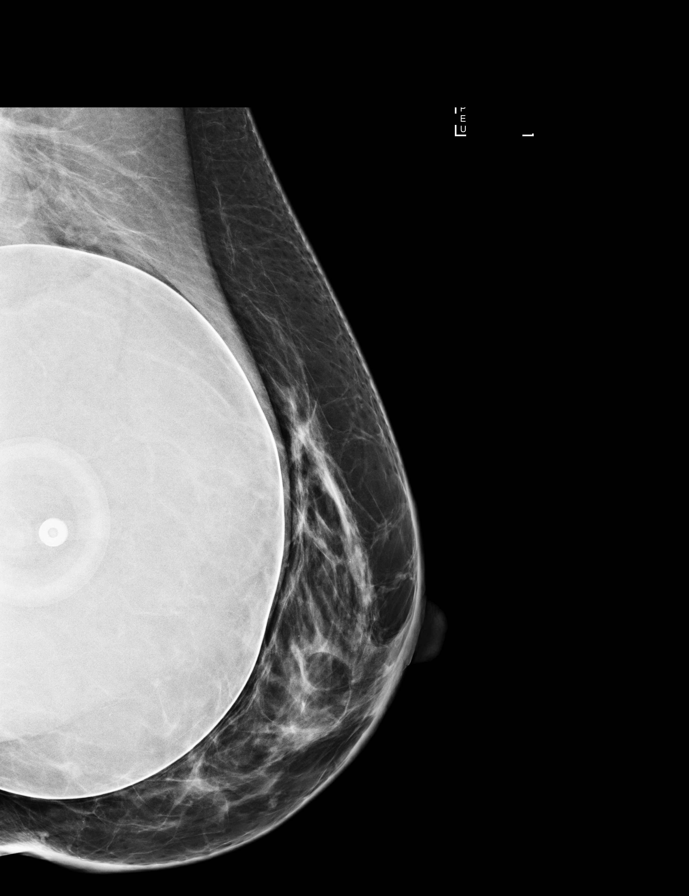

[L CC (1 of 2)]
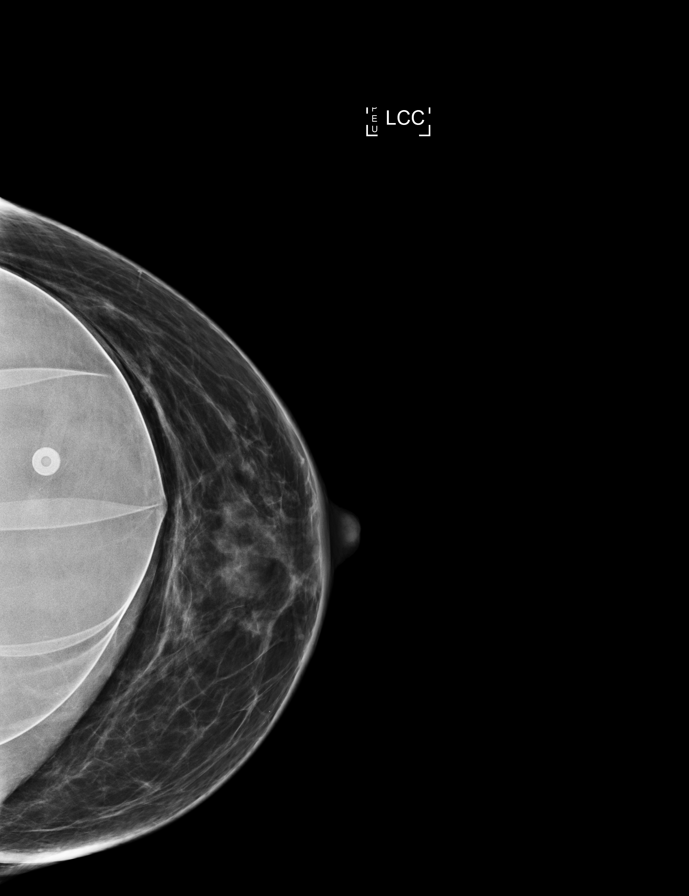

[R MLO (2 of 2)]
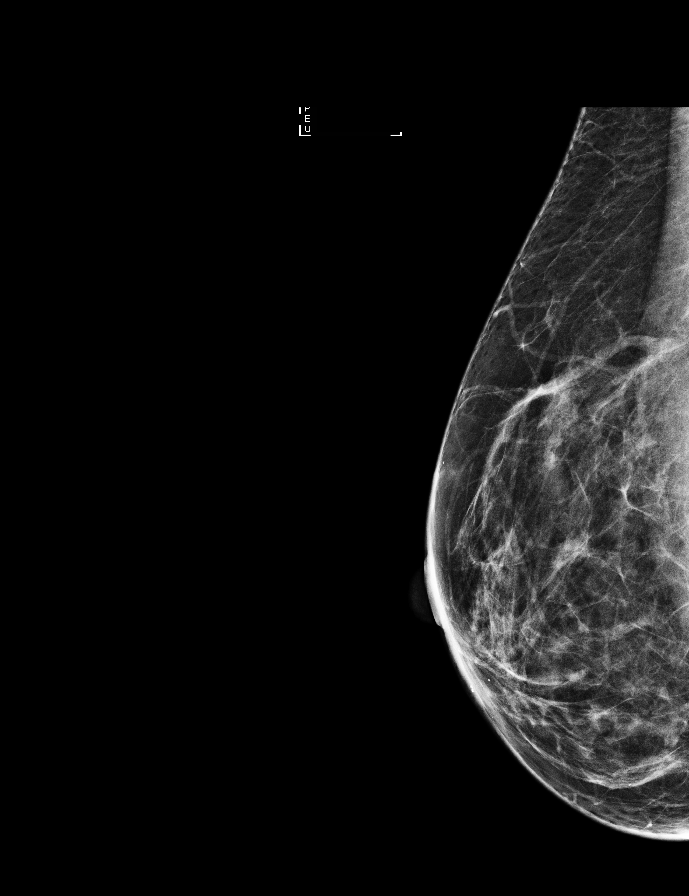

[R CC (2 of 2)]
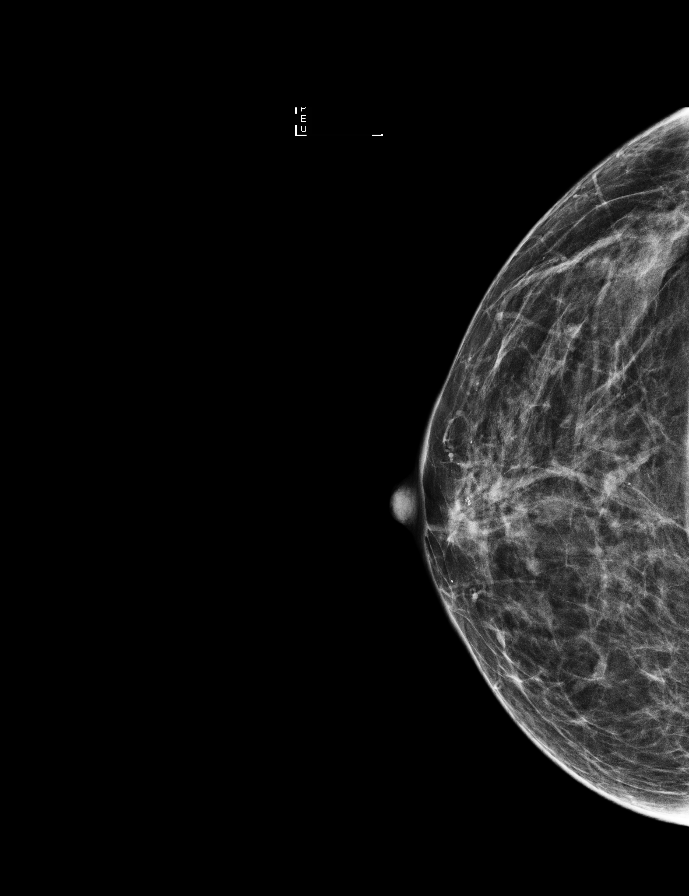

[L CC (2 of 2)]
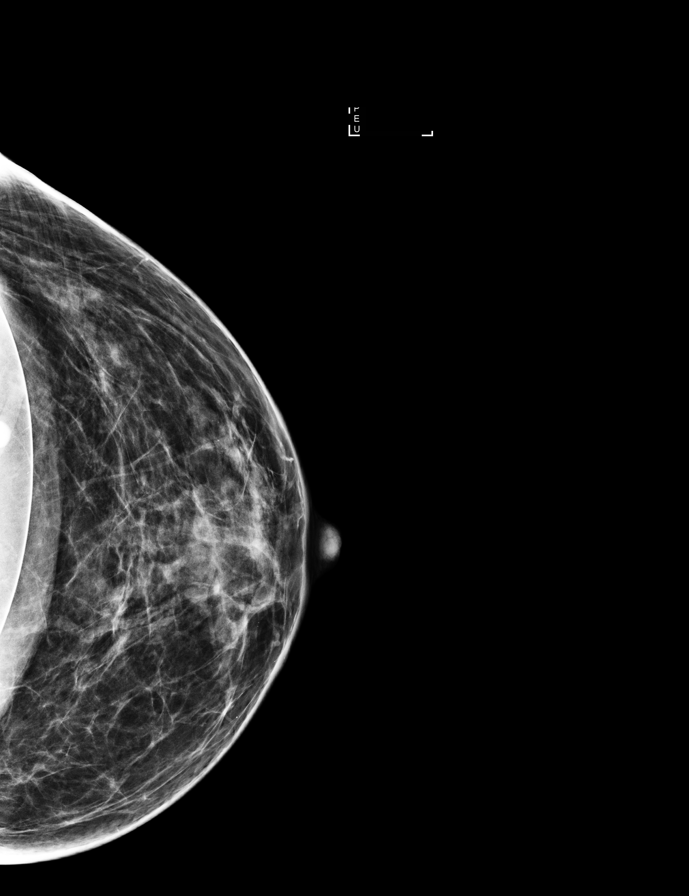

[L MLO (2 of 2)]
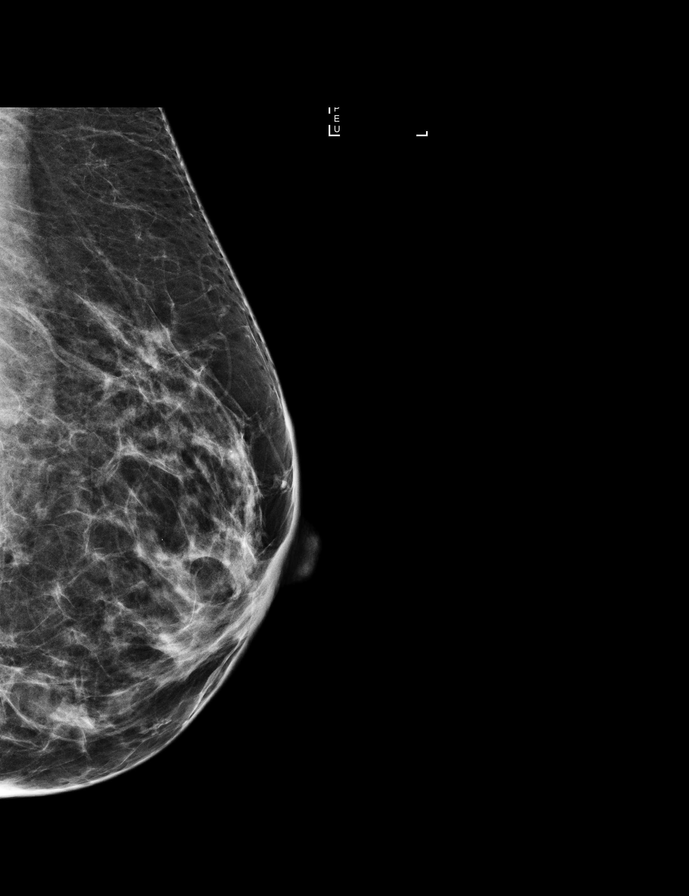

[L CC tomo · tomo slice 29/57.0]
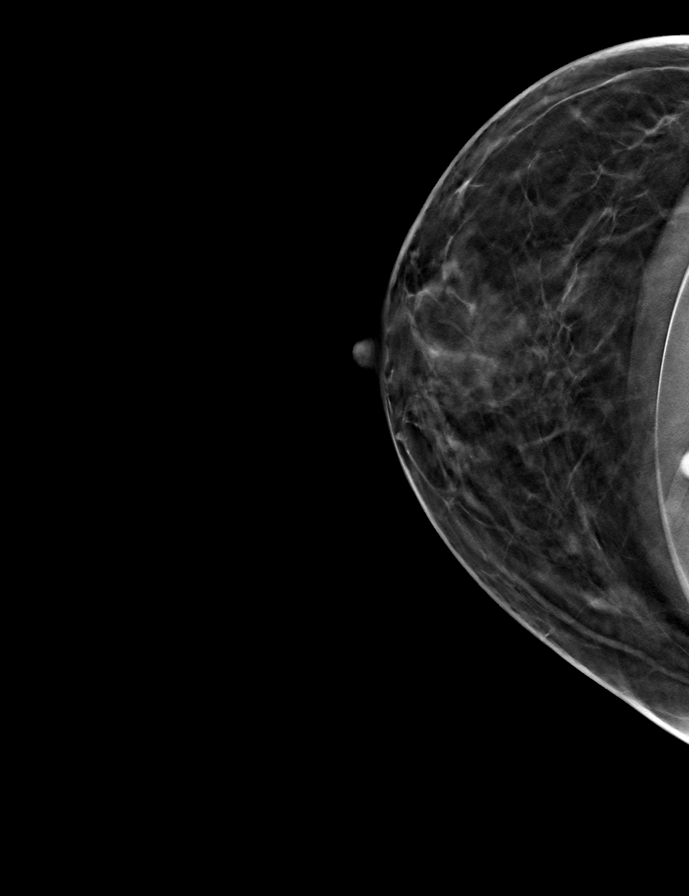

[9 of 28 positions shown; findings below may reference images not displayed]

ACR Breast Density Category b: There are scattered areas of
fibroglandular density.
FINDINGS: There are no findings suspicious for malignancy. Images were
processed with CAD.
IMPRESSION: No mammographic evidence of malignancy. A result letter of this
screening mammogram will be mailed directly to the patient.

RECOMMENDATION:
Screening mammogram in one year. (Code:[id number])

BI-RADS CATEGORY  1:  Negative.

## 2015-08-08 ENCOUNTER — Encounter: Payer: BC Managed Care – PPO | Admitting: Gynecology

## 2015-08-31 ENCOUNTER — Other Ambulatory Visit: Payer: Self-pay | Admitting: Gynecology

## 2015-09-10 ENCOUNTER — Other Ambulatory Visit: Payer: Self-pay | Admitting: *Deleted

## 2015-09-10 MED ORDER — PROGESTERONE MICRONIZED 100 MG PO CAPS: ORAL_CAPSULE | ORAL | Status: DC

## 2015-09-10 NOTE — Telephone Encounter (Signed)
Annual scheduled on 11/20/15

## 2015-09-25 ENCOUNTER — Telehealth: Payer: Self-pay | Admitting: *Deleted

## 2015-09-25 DIAGNOSIS — Z1321 Encounter for screening for nutritional disorder: Secondary | ICD-10-CM

## 2015-09-25 DIAGNOSIS — Z13220 Encounter for screening for lipoid disorders: Secondary | ICD-10-CM

## 2015-09-25 DIAGNOSIS — Z01419 Encounter for gynecological examination (general) (routine) without abnormal findings: Secondary | ICD-10-CM

## 2015-09-25 DIAGNOSIS — Z1329 Encounter for screening for other suspected endocrine disorder: Secondary | ICD-10-CM

## 2015-09-25 NOTE — Telephone Encounter (Signed)
Okay for CBC, comprehensive metabolic panel, lipid profile, urinalysis, thyroid panel, vitamin D level. Doing other "hormone levels" of no real benefit from an estrogen or progesterone level.

## 2015-09-25 NOTE — Telephone Encounter (Signed)
Left on voicemail to schedule lab appointment, orders placed.

## 2015-09-25 NOTE — Telephone Encounter (Signed)
Pt has annual scheduled on 11/20/15 would like to have labs done prior to annual exam which she would like to included "hormones levels" checked. Pt said she has been feeling tired and "out of wack". Please advise

## 2015-09-26 ENCOUNTER — Other Ambulatory Visit: Payer: Self-pay | Admitting: Gynecology

## 2015-09-26 ENCOUNTER — Telehealth: Payer: Self-pay

## 2015-09-26 ENCOUNTER — Other Ambulatory Visit: Payer: BLUE CROSS/BLUE SHIELD

## 2015-09-26 DIAGNOSIS — Z13220 Encounter for screening for lipoid disorders: Secondary | ICD-10-CM

## 2015-09-26 DIAGNOSIS — E349 Endocrine disorder, unspecified: Secondary | ICD-10-CM

## 2015-09-26 DIAGNOSIS — Z01419 Encounter for gynecological examination (general) (routine) without abnormal findings: Secondary | ICD-10-CM

## 2015-09-26 DIAGNOSIS — Z1321 Encounter for screening for nutritional disorder: Secondary | ICD-10-CM

## 2015-09-26 DIAGNOSIS — Z1329 Encounter for screening for other suspected endocrine disorder: Secondary | ICD-10-CM

## 2015-09-26 LAB — COMPREHENSIVE METABOLIC PANEL
ALT: 16 U/L (ref 6–29)
AST: 24 U/L (ref 10–35)
Albumin: 4.1 g/dL (ref 3.6–5.1)
Alkaline Phosphatase: 46 U/L (ref 33–130)
BUN: 19 mg/dL (ref 7–25)
CO2: 24 mmol/L (ref 20–31)
Calcium: 9.2 mg/dL (ref 8.6–10.4)
Chloride: 105 mmol/L (ref 98–110)
Creat: 1.09 mg/dL — ABNORMAL HIGH (ref 0.50–1.05)
Glucose, Bld: 96 mg/dL (ref 65–99)
Potassium: 4.9 mmol/L (ref 3.5–5.3)
Sodium: 141 mmol/L (ref 135–146)
Total Bilirubin: 0.3 mg/dL (ref 0.2–1.2)
Total Protein: 6.6 g/dL (ref 6.1–8.1)

## 2015-09-26 LAB — CBC WITH DIFFERENTIAL/PLATELET
Basophils Absolute: 0 10*3/uL (ref 0.0–0.1)
Basophils Relative: 1 % (ref 0–1)
Eosinophils Absolute: 0.3 10*3/uL (ref 0.0–0.7)
Eosinophils Relative: 6 % — ABNORMAL HIGH (ref 0–5)
HCT: 41.8 % (ref 36.0–46.0)
Hemoglobin: 13.8 g/dL (ref 12.0–15.0)
Lymphocytes Relative: 35 % (ref 12–46)
Lymphs Abs: 1.5 10*3/uL (ref 0.7–4.0)
MCH: 30.9 pg (ref 26.0–34.0)
MCHC: 33 g/dL (ref 30.0–36.0)
MCV: 93.7 fL (ref 78.0–100.0)
MPV: 11.4 fL (ref 8.6–12.4)
Monocytes Absolute: 0.4 10*3/uL (ref 0.1–1.0)
Monocytes Relative: 10 % (ref 3–12)
Neutro Abs: 2 10*3/uL (ref 1.7–7.7)
Neutrophils Relative %: 48 % (ref 43–77)
Platelets: 221 10*3/uL (ref 150–400)
RBC: 4.46 MIL/uL (ref 3.87–5.11)
RDW: 13.4 % (ref 11.5–15.5)
WBC: 4.2 10*3/uL (ref 4.0–10.5)

## 2015-09-26 LAB — LIPID PANEL
Cholesterol: 183 mg/dL (ref 125–200)
HDL: 72 mg/dL (ref >=46)
LDL Cholesterol: 99 mg/dL (ref <130)
Total CHOL/HDL Ratio: 2.5 Ratio (ref <=5.0)
Triglycerides: 59 mg/dL (ref <150)
VLDL: 12 mg/dL (ref <30)

## 2015-09-26 LAB — THYROID PANEL WITH TSH
Free Thyroxine Index: 2 (ref 1.4–3.8)
T3 Uptake: 33 % (ref 22–35)
T4, Total: 6.1 ug/dL (ref 4.5–12.0)
TSH: 1.692 u[IU]/mL (ref 0.350–4.500)

## 2015-09-26 NOTE — Telephone Encounter (Signed)
Patient called stating when she comes for appt she wants to talk with Dr. Loetta Rough about lowering hrt or stopping it.  She is coming prior to visit for labs and wants him to check whatever labs he needs to to assist with this decision.   I left her a message that Dr. Loetta Rough said no value in checking est or progest levels.  I told her labs have been ordered and she needs to make a lab appt.

## 2015-09-27 LAB — URINALYSIS W MICROSCOPIC + REFLEX CULTURE
Bacteria, UA: NONE SEEN [HPF]
Bilirubin Urine: NEGATIVE
Casts: NONE SEEN [LPF]
Crystals: NONE SEEN [HPF]
Glucose, UA: NEGATIVE
Hgb urine dipstick: NEGATIVE
Ketones, ur: NEGATIVE
Leukocytes, UA: NEGATIVE
Nitrite: NEGATIVE
Protein, ur: NEGATIVE
RBC / HPF: NONE SEEN RBC/HPF (ref <=2)
Specific Gravity, Urine: 1.022 (ref 1.001–1.035)
Yeast: NONE SEEN [HPF]
pH: 6 (ref 5.0–8.0)

## 2015-09-27 LAB — VITAMIN D 25 HYDROXY (VIT D DEFICIENCY, FRACTURES): Vit D, 25-Hydroxy: 41 ng/mL (ref 30–100)

## 2015-09-27 LAB — FOLLICLE STIMULATING HORMONE: FSH: 45.1 m[IU]/mL

## 2015-09-28 ENCOUNTER — Other Ambulatory Visit: Payer: Self-pay | Admitting: Gynecology

## 2015-09-29 ENCOUNTER — Other Ambulatory Visit: Payer: Self-pay | Admitting: *Deleted

## 2015-09-29 LAB — URINE CULTURE: Colony Count: 70000

## 2015-09-29 MED ORDER — AMPICILLIN 500 MG PO CAPS: 500.0000 mg | ORAL_CAPSULE | Freq: Four times a day (QID) | ORAL | Status: DC

## 2015-10-01 ENCOUNTER — Encounter (HOSPITAL_COMMUNITY): Payer: Self-pay | Admitting: Emergency Medicine

## 2015-10-01 ENCOUNTER — Emergency Department (INDEPENDENT_AMBULATORY_CARE_PROVIDER_SITE_OTHER): Payer: BLUE CROSS/BLUE SHIELD

## 2015-10-01 ENCOUNTER — Emergency Department (HOSPITAL_COMMUNITY)
Admission: EM | Admit: 2015-10-01 | Discharge: 2015-10-01 | Disposition: A | Discharge status: Home / Self Care | Payer: BLUE CROSS/BLUE SHIELD | Source: Home / Self Care

## 2015-10-01 DIAGNOSIS — S6000XA Contusion of unspecified finger without damage to nail, initial encounter: Secondary | ICD-10-CM

## 2015-10-01 NOTE — ED Notes (Signed)
Patient smashed fingers in garage door.  Right middle, ring, and little fingertips have been smashed.

## 2015-10-01 NOTE — ED Provider Notes (Signed)
CSN: [id number]     Arrival date & time 10/01/15  1754 History   None    Chief Complaint  Patient presents with  . Hand Pain   (Consider location/radiation/quality/duration/timing/severity/associated sxs/prior Treatment) HPI History obtained from patient:   LOCATION:right hand SEVERITY:6 DURATION:about 2 hours ago CONTEXT:fingers caught in garage door   MODIFYING FACTORS:cold packs ASSOCIATED SYMPTOMS:bruise on pad of fingers TIMING: constant    History reviewed. No pertinent past medical history. Past Surgical History  Procedure Laterality Date  . Pelvic laparoscopy    . Knee surgery Right 2010  . Knee surgery Bilateral   . Endometrial ablation  2006  . Breast enhancement surgery    . Augmentation mammaplasty    . Elbow surgery     Family History  Problem Relation Age of Onset  . Diabetes Mother   . Hypertension Mother   . Kidney failure Mother   . Hypertension Father   . Diabetes Father   . Dementia Father   . Breast cancer Maternal Grandmother 47  . Cancer Paternal Grandmother     ? Uterine or ovarian   Social History  Substance Use Topics  . Smoking status: Former Research scientist (life sciences)  . Smokeless tobacco: None  . Alcohol Use: Yes     Comment: Social   OB History    Gravida Para Term Preterm AB TAB SAB Ectopic Multiple Living   4 4        4      Review of Systems ROS +'ve right hand injury  Denies: HEADACHE, NAUSEA, ABDOMINAL PAIN, CHEST PAIN, CONGESTION, DYSURIA, SHORTNESS OF BREATH  Allergies  Erythromycin  Home Medications   Prior to Admission medications   Medication Sig Start Date End Date Taking? Authorizing Provider  ampicillin (PRINCIPEN) 500 MG capsule Take 1 capsule (500 mg total) by mouth 4 (four) times daily. 09/29/15   Anastasio Auerbach, MD  Ascorbic Acid (VITAMIN C PO) Take 1 tablet by mouth daily.     Historical Provider, MD  aspirin 81 MG tablet Take 81 mg by mouth daily.    Historical Provider, MD  Cholecalciferol (VITAMIN D PO) Take 1  tablet by mouth daily.     Historical Provider, MD  citalopram (CELEXA) 20 MG tablet Take by mouth daily. 1/2 TAB DAILY 10/16/14   Historical Provider, MD  MINIVELLE 0.075 MG/24HR APPLY 1 PATCH ONTO THE SKIN TWICE WEEKLY 09/29/15   Anastasio Auerbach, MD  Multiple Vitamin (MULTIVITAMIN) tablet Take 1 tablet by mouth daily.    Historical Provider, MD  Omega-3 Fatty Acids (OMEGA 3 PO) Take 1 tablet by mouth daily.     Historical Provider, MD  progesterone (PROMETRIUM) 100 MG capsule TAKE 1 CAPSULE BY MOUTH AT BEDTIME 09/10/15   Timothy P Fontaine, MD  RESTASIS 0.05 % ophthalmic emulsion Take 1 tablet by mouth daily. 10/21/14   Historical Provider, MD  zolpidem (AMBIEN) 10 MG tablet TAKE 1 TABLET BY MOUTH AT BEDTIME AS NEEDED FOR INSOMNIA 04/11/15   Anastasio Auerbach, MD   Meds Ordered and Administered this Visit  Medications - No data to display  There were no vitals taken for this visit. No data found.   Physical Exam  Constitutional: She appears well-developed and well-nourished.  Musculoskeletal: She exhibits tenderness.       Hands: Nursing note and vitals reviewed.   ED Course  Procedures (including critical care time)  Labs Review Labs Reviewed - No data to display  Imaging Review Dg Hand Complete Right  10/01/2015  CLINICAL  DATA:  Patient crushed fingers in garage door today; She complains fo pain distal phalanx of 2nd through 5th digits EXAM: RIGHT HAND - COMPLETE 3+ VIEW COMPARISON:  None FINDINGS: There is no evidence of fracture or dislocation. Corticated ossicle distal to the ulnar styloid. There is no evidence of arthropathy or other focal bone abnormality. Soft tissues are unremarkable. IMPRESSION: Negative. Electronically Signed   By: Lucrezia Europe M.D.   On: 10/01/2015 19:24     Visual Acuity Review  Right Eye Distance:   Left Eye Distance:   Bilateral Distance:    Right Eye Near:   Left Eye Near:    Bilateral Near:         MDM   1. Finger contusion,  initial encounter    No sign of fracture on x-ray just patient continuation of symptomatic treatment including cold compresses on the use of Tylenol and ibuprofen as needed. Activity as tolerated. Follow-up with PCP   as needed. Instructions of care are provided discharged home in stable condition.  THIS NOTE WAS GENERATED USING A VOICE RECOGNITION SOFTWARE PROGRAM. ALL REASONABLE EFFORTS  WERE MADE TO PROOFREAD THIS DOCUMENT FOR ACCURACY.     Konrad Felix, [place] 10/01/15 1933

## 2015-10-01 NOTE — Discharge Instructions (Signed)
Contusion A contusion is a deep bruise. Contusions happen when an injury causes bleeding under the skin. Symptoms of bruising include pain, swelling, and discolored skin. The skin may turn blue, purple, or yellow. HOME CARE   Rest the injured area.  If told, put ice on the injured area.  Put ice in a plastic bag.  Place a towel between your skin and the bag.  Leave the ice on for 20 minutes, 2-3 times per day.  If told, put light pressure (compression) on the injured area using an elastic bandage. Make sure the bandage is not too tight. Remove it and put it back on as told by your doctor.  If possible, raise (elevate) the injured area above the level of your heart while you are sitting or lying down.  Take over-the-counter and prescription medicines only as told by your doctor. GET HELP IF:  Your symptoms do not get better after several days of treatment.  Your symptoms get worse.  You have trouble moving the injured area. GET HELP RIGHT AWAY IF:   You have very bad pain.  You have a loss of feeling (numbness) in a hand or foot.  Your hand or foot turns pale or cold.   This information is not intended to replace advice given to you by your health care provider. Make sure you discuss any questions you have with your health care provider.   Document Released: 04/19/2008 Document Revised: 07/23/2015 Document Reviewed: 03/19/2015 Elsevier Interactive Patient Education 2016 Elsevier Inc[employer]  Subungual Hematoma  A subungual hematoma is a pocket of blood under the fingernail or toenail. The nail may turn blue or feel painful. HOME CARE  Put ice on the injured area.  Put ice in a plastic bag.  Place a towel between your skin and the bag.  Leave the ice on for 15-20 minutes, 03-04 times a day. Do this for the first 1 to 2 days.  Raise (elevate) the injured area to lessen pain and puffiness (swelling).  If you were given a bandage, wear it for as long as told by your  doctor.  If part of your nail falls off, trim the rest of the nail gently.  Only take medicines as told by your doctor. GET HELP RIGHT AWAY IF:  You have redness or puffiness around the nail.  You have yellowish-white fluid (pus) coming from the nail.  Your pain does not get better with medicine.  You have a fever. MAKE SURE YOU:  Understand these instructions.  Will watch your condition.  Will get help right away if you are not doing well or get worse.   This information is not intended to replace advice given to you by your health care provider. Make sure you discuss any questions you have with your health care provider.   Document Released: 01/24/2012 Document Reviewed: 03/19/2015 Elsevier Interactive Patient Education [employer].

## 2015-10-01 NOTE — ED Notes (Signed)
Patient mentioned headache complaint for 8 weeks.  Told patient that this location does not have access to ct, etc.  Patient has been seen by dr Erik Obey.  Patient missed follow up appt.  Patient currently has a call in to specialist.  Encouraged patient to maintain ent relationship.

## 2015-11-20 ENCOUNTER — Encounter: Payer: Self-pay | Admitting: Gynecology

## 2015-12-07 ENCOUNTER — Other Ambulatory Visit: Payer: Self-pay | Admitting: Gynecology

## 2016-01-02 ENCOUNTER — Ambulatory Visit (INDEPENDENT_AMBULATORY_CARE_PROVIDER_SITE_OTHER): Payer: BLUE CROSS/BLUE SHIELD | Admitting: Gynecology

## 2016-01-02 ENCOUNTER — Encounter: Payer: Self-pay | Admitting: Gynecology

## 2016-01-02 ENCOUNTER — Other Ambulatory Visit (HOSPITAL_COMMUNITY)
Admission: RE | Admit: 2016-01-02 | Discharge: 2016-01-02 | Disposition: A | Discharge status: Home / Self Care | Payer: BLUE CROSS/BLUE SHIELD | Source: Ambulatory Visit | Attending: Gynecology | Admitting: Gynecology

## 2016-01-02 VITALS — BP 112/64 | Ht 67.0 in | Wt 168.0 lb

## 2016-01-02 DIAGNOSIS — Z79890 Hormone replacement therapy (postmenopausal): Secondary | ICD-10-CM | POA: Diagnosis not present

## 2016-01-02 DIAGNOSIS — D251 Intramural leiomyoma of uterus: Secondary | ICD-10-CM | POA: Diagnosis not present

## 2016-01-02 DIAGNOSIS — Z01419 Encounter for gynecological examination (general) (routine) without abnormal findings: Secondary | ICD-10-CM

## 2016-01-02 MED ORDER — PROGESTERONE MICRONIZED 100 MG PO CAPS: 100.0000 mg | ORAL_CAPSULE | Freq: Every day | ORAL | Status: DC

## 2016-01-02 MED ORDER — ESTRADIOL 0.075 MG/24HR TD PTTW: MEDICATED_PATCH | TRANSDERMAL | Status: AC

## 2016-01-02 NOTE — Patient Instructions (Signed)

## 2016-01-02 NOTE — Progress Notes (Signed)
Brandy Henson 1956-05-29 [id number]        60 y.o.  G4P4  for annual exam.  Several issues noted below.  Past medical history,surgical history, problem list, medications, allergies, family history and social history were all reviewed and documented as reviewed in the EPIC chart.  ROS:  Performed with pertinent positives and negatives included in the history, assessment and plan.   Additional significant findings :  none   Exam: [name] assistant Filed Vitals:   01/02/16 1026  BP: 112/64  Height: 5\' 7"  (1.702 m)  Weight: 168 lb (76.204 kg)   General appearance:  Normal affect, orientation and appearance. Skin: Grossly normal HEENT: Without gross lesions.  No cervical or supraclavicular adenopathy. Thyroid normal.  Lungs:  Clear without wheezing, rales or rhonchi Cardiac: RR, without RMG Abdominal:  Soft, nontender, without masses, guarding, rebound, organomegaly or hernia Breasts:  Examined lying and sitting without masses, retractions, discharge or axillary adenopathy.  Bilateral implants noted with well-healed scars Pelvic:  Ext/BUS/vagina normal mild atrophic changes  Cervix normal. Pap smear done  Uterus retroverted, globally system past exam and leiomyoma  Adnexa without masses or tenderness    Anus and perineum normal   Rectovaginal normal sphincter tone without palpated masses or tenderness.    Assessment/Plan:  60 y.o. G82P4 female for annual exam.   1. Postmenopausal/HRT.  Patient continues on minivelle 0.075 and Prometrium 100 mg nightly. No bleeding. Good symptom relief. I again reviewed with her and her husband the whole issue of HRT to include risks/benefits and how long to continue. I reviewed the WHI study with increased risk of stroke, heart attack, DVT of breast cancer. Question as to timing and early perimenopausal start being possibly cardioprotective as well as bone protection and decreased risk of colon cancer. Use of transdermal decreasing thrombosis risk  and lastly went to wean. At this point the patient feels very comfortable continuing and wants to do so and I refilled 1 year.  Call if any bleeding. 2. History of leiomyoma. Uterus stable in size. I reviewed with her the issues of leiomyoma and cannot guarantee it is not leiomyosarcoma but given the statistical improbability and stable exam she is comfortable just monitoring annual exam. 3. Pap smear 07/2014.  Relates years ago having a positive HPV screen which was negative the following year and had normal Pap smears from a cytology standpoint.she is uncomfortable with less frequent screening I did a Pap smear today. The issues of screening for HPV in general discussed recognizing that only a limited number of subtypes our screen. 4. Mammography 08/2014. Patient knows she is overdue and agrees to call and schedule at the breast center. SBE monthly reviewed. 5. Colonoscopy 2012. Repeat at their recommended interval. 6. DEXA years ago. We'll plan further instant menopause as she does remain on HRT and had a normal recent vitamin D level. 7. Health maintenance.  Had lab work drawn in November. All of which was normal. She did have a marginal elevated creatinine at 1.09 normal values previously. BUN is normal at 19. Options to repeat our monitor reviewed. Given the minimal elevation we both feel comfortable not repeating until next year his blood work.  Assuming patient continues well she'll see me in a year or sooner as needed.   Anastasio Auerbach MD, 11:24 AM 01/02/2016

## 2016-01-06 LAB — CYTOLOGY - PAP

## 2016-01-08 ENCOUNTER — Other Ambulatory Visit: Payer: Self-pay

## 2016-01-08 DIAGNOSIS — Z1231 Encounter for screening mammogram for malignant neoplasm of breast: Secondary | ICD-10-CM

## 2016-01-29 ENCOUNTER — Ambulatory Visit: Payer: BLUE CROSS/BLUE SHIELD

## 2016-02-17 ENCOUNTER — Ambulatory Visit
Admission: RE | Admit: 2016-02-17 | Discharge: 2016-02-17 | Disposition: A | Discharge status: Home / Self Care | Payer: BLUE CROSS/BLUE SHIELD | Source: Ambulatory Visit

## 2016-02-17 ENCOUNTER — Other Ambulatory Visit: Payer: Self-pay

## 2016-02-17 DIAGNOSIS — Z1231 Encounter for screening mammogram for malignant neoplasm of breast: Secondary | ICD-10-CM

## 2016-03-11 ENCOUNTER — Other Ambulatory Visit: Payer: Self-pay | Admitting: Gynecology

## 2016-11-01 ENCOUNTER — Other Ambulatory Visit: Payer: Self-pay | Admitting: Gynecology

## 2016-12-03 ENCOUNTER — Ambulatory Visit (INDEPENDENT_AMBULATORY_CARE_PROVIDER_SITE_OTHER): Payer: BLUE CROSS/BLUE SHIELD | Admitting: Gynecology

## 2016-12-03 ENCOUNTER — Encounter: Payer: Self-pay | Admitting: Gynecology

## 2016-12-03 VITALS — BP 104/70

## 2016-12-03 DIAGNOSIS — N951 Menopausal and female climacteric states: Secondary | ICD-10-CM | POA: Diagnosis not present

## 2016-12-03 DIAGNOSIS — Z79890 Hormone replacement therapy (postmenopausal): Secondary | ICD-10-CM

## 2016-12-03 NOTE — Progress Notes (Signed)
    Brandy Henson [birth year] [id number]        61 y.o.  G4P4 presents with her husband to to discuss HRT. Currently on estradiol 0.075 mg patch and Prometrium 100 mg nightly.  Is having issues with the patch staining on and that she likes hot baths and apparently this is causing an issue with the adhesive. She's having more emotional swings and wondering whether the patch coming off is contributing to this. Not having significant hot flushes or sweats with the patch. Has done a lot of reading and has concerns as far as the safety of continuing HRT.  Past medical history,surgical history, problem list, medications, allergies, family history and social history were all reviewed and documented in the EPIC chart.  Directed ROS with pertinent positives and negatives documented in the history of present illness/assessment and plan.  Exam: Vitals:   12/03/16 1429  BP: 104/70   General appearance:  Normal   Assessment/Plan:  61 y.o. G4P4 The patient, her husband and I had a very extensive discussion about HRT to include the risks/benefits. I reviewed various studies to include the WHI study as well as more recent studies and the latest NAMS 2017 guidelines on HRT. The timing hypophysis as far as starting early and possibly having cardiovascular bone health benefits reviewed. Various modalities to include pill patch spray gel vaginal ring administration routes reviewed. The first pass effect issues and possible decreased thrombosis risk with transvaginal/transdermal absorption reviewed. The risks of stroke heart attack DVT and breast cancer associated with HRT studies discussed. Options for management to include switching to a different delivery route such as oral for the patch or transvaginal also reviewed. At this point the patient's going to avoid hot baths and prefers to keep her patch weight is for now to see if it does not stick better and that she will have less emotional swings. She'll continue on  her Prometrium nightly. She does have an appointment to see me for her annual exam this coming month and follow up at that time and we will readdress her symptoms.  Greater than 50% of my time was spent in direct face to face counseling and coordination of care with the patient.     Anastasio Auerbach MD, 3:41 PM 12/03/2016

## 2016-12-03 NOTE — Patient Instructions (Signed)
follow up for your annual exam as scheduled

## 2016-12-09 ENCOUNTER — Encounter (HOSPITAL_COMMUNITY): Payer: Self-pay | Admitting: Emergency Medicine

## 2016-12-09 ENCOUNTER — Ambulatory Visit (HOSPITAL_COMMUNITY)
Admission: EM | Admit: 2016-12-09 | Discharge: 2016-12-09 | Disposition: A | Discharge status: Home / Self Care | Payer: BLUE CROSS/BLUE SHIELD | Attending: Emergency Medicine | Admitting: Emergency Medicine

## 2016-12-09 DIAGNOSIS — H6001 Abscess of right external ear: Secondary | ICD-10-CM | POA: Diagnosis not present

## 2016-12-09 LAB — POCT I-STAT, CHEM 8
BUN: 15 mg/dL (ref 6–20)
Calcium, Ion: 1.19 mmol/L (ref 1.15–1.40)
Chloride: 103 mmol/L (ref 101–111)
Creatinine, Ser: 0.9 mg/dL (ref 0.44–1.00)
Glucose, Bld: 94 mg/dL (ref 65–99)
HCT: 42 % (ref 36.0–46.0)
Hemoglobin: 14.3 g/dL (ref 12.0–15.0)
Potassium: 4.4 mmol/L (ref 3.5–5.1)
Sodium: 140 mmol/L (ref 135–145)
TCO2: 26 mmol/L (ref 0–100)

## 2016-12-09 NOTE — Discharge Instructions (Signed)
You have a small pimple or abscess of your ear canal. Continue the Keflex and Cortisporin as prescribed. These are excellent medicines for this. If you are having persistent pain after he finished the medication, follow-up with the ENT doctor. I like the physicians at the [facility]. They have offices here in [place] [employer], [place], and [place]. The [place] office tends to be very busy. I particularly like Dr. Hulan Fray, Dr. Kennon Rounds, and Dr. Harolyn Rutherford.

## 2016-12-09 NOTE — ED Provider Notes (Signed)
[place]    CSN: [id number] Arrival date & time: 12/09/16  [place]     History   Chief Complaint Chief Complaint  Patient presents with  . Dizziness    HPI Brandy Henson is a 61 y.o. female.   HPI  She is a 61 year old woman here for evaluation of right ear pain. She has been having pain in the right ear for several days. She also reports a tingling and painful sensation that will radiate into her right forehead. She also reports feeling a little woozy at times. She saw her doctor yesterday and was told she has a pimple in her ear canal. She was started on Keflex and Cortisporin drops. She is concerned about parasitic organisms and is unsure of how this occurred since she does not use anything in her ears.  Additionally, she reports some hot and cold chills and intermittent muscle tightening. She states her estrogen patch has not been sticking as well recently.  History reviewed. No pertinent past medical history.  There are no active problems to display for this patient.   Past Surgical History:  Procedure Laterality Date  . AUGMENTATION MAMMAPLASTY    . ELBOW SURGERY    . ENDOMETRIAL ABLATION  2006  . KNEE SURGERY Right 2010  . KNEE SURGERY Bilateral   . PELVIC LAPAROSCOPY      OB History    Gravida Para Term Preterm AB Living   4 4       4    SAB TAB Ectopic Multiple Live Births                   Home Medications    Prior to Admission medications   Medication Sig Start Date End Date Taking? Authorizing Provider  cephALEXin (KEFLEX) 500 MG capsule Take 500 mg by mouth 4 (four) times daily.   Yes Historical Provider, MD  neomycin-colistin-hydrocortisone-thonzonium (CORTISPORIN TC) 3.01-15-09-0.5 MG/ML otic suspension 4 (four) times daily.   Yes Historical Provider, MD  Ascorbic Acid (VITAMIN C PO) Take 1 tablet by mouth daily.     Historical Provider, MD  aspirin 81 MG tablet Take 81 mg by mouth daily.    Historical Provider, MD  Cholecalciferol  (VITAMIN D PO) Take 1 tablet by mouth daily.     Historical Provider, MD  estradiol (MINIVELLE) 0.075 MG/24HR APPLY 1 PATCH ONTO THE SKIN TWICE WEEKLY 01/02/16   Anastasio Auerbach, MD  Multiple Vitamin (MULTIVITAMIN) tablet Take 1 tablet by mouth daily.    Historical Provider, MD  Omega-3 Fatty Acids (OMEGA 3 PO) Take 1 tablet by mouth daily.     Historical Provider, MD  progesterone (PROMETRIUM) 100 MG capsule Take 1 capsule (100 mg total) by mouth at bedtime. 01/02/16   Anastasio Auerbach, MD  RESTASIS 0.05 % ophthalmic emulsion Take 1 tablet by mouth daily. 10/21/14   Historical Provider, MD    Family History Family History  Problem Relation Age of Onset  . Hypertension Father   . Diabetes Father   . Dementia Father   . Breast cancer Maternal Grandmother 71  . Diabetes Mother   . Hypertension Mother   . Kidney failure Mother   . Cancer Paternal Grandmother     ? Uterine or ovarian    Social History Social History  Substance Use Topics  . Smoking status: Former Research scientist (life sciences)  . Smokeless tobacco: Never Used  . Alcohol use Yes     Comment: Social     Allergies  Erythromycin   Review of Systems Review of Systems As in history of present illness  Physical Exam Triage Vital Signs ED Triage Vitals [12/09/16 1936]  Enc Vitals Group     BP 128/71     Pulse Rate 60     Resp 18     Temp 97.1 F (36.2 C)     Temp Source Oral     SpO2 98 %     Weight      Height      Head Circumference      Peak Flow      Pain Score      Pain Loc      Pain Edu?      Excl. in [place]?    No data found.   Updated Vital Signs BP 128/71 (BP Location: Left Arm)   Pulse 60   Temp 97.1 F (36.2 C) (Oral)   Resp 18   SpO2 98%   Visual Acuity Right Eye Distance:   Left Eye Distance:   Bilateral Distance:    Right Eye Near:   Left Eye Near:    Bilateral Near:     Physical Exam  Constitutional: She is oriented to person, place, and time. She appears well-developed and well-nourished.  No distress.  HENT:  Right TM is normal. There is a small amount of earwax in the canal. She does have an erythematous nodule consistent with a pimple. There is some surrounding bruising.  Neck: Neck supple.  Cardiovascular: Normal rate.   Pulmonary/Chest: Effort normal.  Lymphadenopathy:    She has no cervical adenopathy.  Neurological: She is alert and oriented to person, place, and time.     UC Treatments / Results  Labs (all labs ordered are listed, but only abnormal results are displayed) Labs Reviewed  POCT I-STAT, CHEM 8    EKG  EKG Interpretation None       Radiology No results found.  Procedures Procedures (including critical care time)  Medications Ordered in UC Medications - No data to display   Initial Impression / Assessment and Plan / UC Course  I have reviewed the triage vital signs and the nursing notes.  Pertinent labs & imaging results that were available during my care of the patient were reviewed by me and considered in my medical decision making (see chart for details).     Blood work is normal. She does have a small abscess in the ear canal. There is some hemorrhage surrounding this. Recommended she complete the Keflex and Cortisporin course. If she continues to have pain and other symptoms, follow-up with ENT.  Final Clinical Impressions(s) / UC Diagnoses   Final diagnoses:  Abscess, ear canal, right    New Prescriptions New Prescriptions   No medications on file     Melony Overly, MD 12/09/16 2029

## 2016-12-09 NOTE — ED Triage Notes (Signed)
Patient has been seen by pcp for a bump in right ear.    Patient has multiple complaints.    Patient has concerns this may not be the right medicine combination for what is going on today.  Makes several references to feeling "loopy".  Asked for description of "loopy", non specific descriptions.

## 2016-12-22 ENCOUNTER — Other Ambulatory Visit: Payer: Self-pay | Admitting: Gynecology

## 2017-01-03 ENCOUNTER — Encounter: Payer: BLUE CROSS/BLUE SHIELD | Admitting: Gynecology

## 2017-03-03 ENCOUNTER — Other Ambulatory Visit: Payer: Self-pay | Admitting: Obstetrics and Gynecology

## 2017-03-03 DIAGNOSIS — Z1231 Encounter for screening mammogram for malignant neoplasm of breast: Secondary | ICD-10-CM

## 2017-03-19 ENCOUNTER — Other Ambulatory Visit: Payer: Self-pay | Admitting: Gynecology

## 2017-03-21 ENCOUNTER — Ambulatory Visit: Payer: BLUE CROSS/BLUE SHIELD

## 2018-05-17 ENCOUNTER — Telehealth: Payer: Self-pay | Admitting: Hematology

## 2018-05-17 ENCOUNTER — Encounter: Payer: Self-pay | Admitting: Genetics

## 2018-05-17 ENCOUNTER — Encounter: Payer: Self-pay | Admitting: Hematology

## 2018-05-17 NOTE — Telephone Encounter (Signed)
Pt called back stating she needed to be seen by an oncologist for high risk of cancers. Pt has been scheduled to see Dr. Burr Medico on 7/23 at 230pm. Letter mailed to the pt.

## 2018-05-31 ENCOUNTER — Telehealth: Payer: Self-pay | Admitting: Hematology

## 2018-05-31 NOTE — Telephone Encounter (Signed)
Returned call to patient re verifying her appointment. Left message for patient confirming 7/23 new patient appointment at 2:30 pm.

## 2018-06-01 NOTE — Progress Notes (Signed)
[place]  Telephone:(336) [phone] Fax:([phone]) [phone]  Clinic New Consult Note   Patient Care Team: Darcus Austin, MD as PCP - General (Family Medicine) 06/06/2018   Referring Physician: Dr. Radene Knee  CHIEF COMPLAINTS/PURPOSE OF CONSULTATION:  Newly diagnosed Lynch Syndrome, PSM2 mutation (+)  HISTORY OF PRESENTING ILLNESS:  Brandy Henson 62 y.o. female is here because of newly diagnosed lynch syndrome. She is here with her husband. She was referred by her GYN Dr. Radene Knee.  She has no previous history of cancer. She has 4 children. Was 24 when she had her first child. She breastfed. 2 sisters and a brother, none has cancer. No HTN, DM, or other known medical conditions. She sometimes has insomnia. She has surgery on both her knees due to sports injuries. She had right elbow surgery and breast reduction and implants.  She had a uterine ablation due to heavy bleeding. Her menses stopped after that. She states that her past experience with colonoscopy was not the best due to the laxative given. She takes vitamins, estradiol, progesterone, and omega 3.  Smoked in her teens and quit. Occasional drinks. She is a housewife.  FHx: paternal grandmother had a "female" cancer at age 92 Maternal grandmother had breast cancer in early 57s No known history of GI cancers.   MEDICAL HISTORY:  Past Medical History:  Diagnosis Date  . Arthritis     SURGICAL HISTORY: Past Surgical History:  Procedure Laterality Date  . AUGMENTATION MAMMAPLASTY    . ELBOW SURGERY    . ENDOMETRIAL ABLATION  2006  . KNEE SURGERY Right 2010  . KNEE SURGERY Bilateral   . PELVIC LAPAROSCOPY      SOCIAL HISTORY: Social History   Socioeconomic History  . Marital status: Married    Spouse name: Not on file  . Number of children: Not on file  . Years of education: Not on file  . Highest education level: Not on file  Occupational History  . Not on file  Social Needs  . Financial resource  strain: Not on file  . Food insecurity:    Worry: Not on file    Inability: Not on file  . Transportation needs:    Medical: Not on file    Non-medical: Not on file  Tobacco Use  . Smoking status: Former Smoker    Years: 2.00  . Smokeless tobacco: Never Used  Substance and Sexual Activity  . Alcohol use: Yes    Comment: Social  . Drug use: No  . Sexual activity: Yes    Birth control/protection: Post-menopausal  Lifestyle  . Physical activity:    Days per week: Not on file    Minutes per session: Not on file  . Stress: Not on file  Relationships  . Social connections:    Talks on phone: Not on file    Gets together: Not on file    Attends religious service: Not on file    Active member of club or organization: Not on file    Attends meetings of clubs or organizations: Not on file    Relationship status: Not on file  . Intimate partner violence:    Fear of current or ex partner: Not on file    Emotionally abused: Not on file    Physically abused: Not on file    Forced sexual activity: Not on file  Other Topics Concern  . Not on file  Social History Narrative  . Not on file    FAMILY HISTORY:  Family History  Problem Relation Age of Onset  . Hypertension Father   . Diabetes Father   . Dementia Father   . Breast cancer Maternal Grandmother 35  . Diabetes Mother   . Hypertension Mother   . Kidney failure Mother   . Cancer Paternal Grandmother        ? Uterine or ovarian    ALLERGIES:  is allergic to erythromycin.  MEDICATIONS:  Current Outpatient Medications  Medication Sig Dispense Refill  . Ascorbic Acid (VITAMIN C PO) Take 1 tablet by mouth daily.     . Cholecalciferol (VITAMIN D PO) Take 1 tablet by mouth daily.     Marland Kitchen estradiol (MINIVELLE) 0.075 MG/24HR APPLY 1 PATCH ONTO THE SKIN TWICE WEEKLY 24 patch 4  . Multiple Vitamin (MULTIVITAMIN) tablet Take 1 tablet by mouth daily.    . Omega-3 Fatty Acids (OMEGA 3 PO) Take 1 tablet by mouth daily.     .  progesterone (PROMETRIUM) 100 MG capsule Take 1 capsule (100 mg total) by mouth at bedtime. 90 capsule 4  . MINIVELLE 0.075 MG/24HR APPLY 1 PATCH ON THE SKIN TWICE WEEKLY (Patient not taking: Reported on 06/06/2018) 24 patch 0  . neomycin-colistin-hydrocortisone-thonzonium (CORTISPORIN TC) 3.01-15-09-0.5 MG/ML otic suspension 4 (four) times daily.    . RESTASIS 0.05 % ophthalmic emulsion Take 1 tablet by mouth daily.     No current facility-administered medications for this visit.     REVIEW OF SYSTEMS:   Constitutional: Denies fevers, chills or abnormal night sweats Eyes: Denies blurriness of vision, double vision or watery eyes Ears, nose, mouth, throat, and face: Denies mucositis or sore throat Respiratory: Denies cough, dyspnea or wheezes Cardiovascular: Denies palpitation, chest discomfort or lower extremity swelling Gastrointestinal:  Denies nausea, heartburn or change in bowel habits Skin: Denies abnormal skin rashes Lymphatics: Denies new lymphadenopathy or easy bruising Neurological:Denies numbness, tingling or new weaknesses MSK: (+) multiple sports injuries Behavioral/Psych: Mood is stable, no new changes  All other systems were reviewed with the patient and are negative.  PHYSICAL EXAMINATION:  ECOG PERFORMANCE STATUS: 0 - Asymptomatic  Vitals:   06/06/18 1453  BP: 134/80  Pulse: (!) 46  Resp: 17  Temp: 98.5 F (36.9 C)  SpO2: 100%   Filed Weights   06/06/18 1453  Weight: 175 lb 3.2 oz (79.5 kg)    GENERAL:alert, no distress and comfortable SKIN: skin color, texture, turgor are normal, no rashes or significant lesions EYES: normal, conjunctiva are pink and non-injected, sclera clear OROPHARYNX:no exudate, no erythema and lips, buccal mucosa, and tongue normal  NECK: supple, thyroid normal size, non-tender, without nodularity LYMPH:  no palpable lymphadenopathy in the cervical, axillary or inguinal LUNGS: clear to auscultation and percussion with normal breathing  effort HEART: regular rate & rhythm and no murmurs and no lower extremity edema ABDOMEN:abdomen soft, non-tender and normal bowel sounds Musculoskeletal:no cyanosis of digits and no clubbing  PSYCH: alert & oriented x 3 with fluent speech NEURO: no focal motor/sensory deficits  LABORATORY DATA:  I have reviewed the data as listed CBC Latest Ref Rng & Units 12/09/2016 09/26/2015 07/30/2014  WBC 4.0 - 10.5 K/uL - 4.2 4.3  Hemoglobin 12.0 - 15.0 g/dL 14.3 13.8 13.3  Hematocrit 36.0 - 46.0 % 42.0 41.8 39.8  Platelets 150 - 400 K/uL - 221 213    CMP Latest Ref Rng & Units 12/09/2016 09/26/2015 07/30/2014  Glucose 65 - 99 mg/dL 94 96 98  BUN 6 - 20 mg/dL _0 Creatinine  0.44 - 1.00 mg/dL 0.90 1.09(H) 0.88  Sodium 135 - 145 mmol/L 140 141 139  Potassium 3.5 - 5.1 mmol/L 4.4 4.9 4.0  Chloride 101 - 111 mmol/L 103 105 107  CO2 20 - 31 mmol/L - 24 27  Calcium 8.6 - 10.4 mg/dL - 9.2 8.9  Total Protein 6.1 - 8.1 g/dL - 6.6 6.6  Total Bilirubin 0.2 - 1.2 mg/dL - 0.3 0.6  Alkaline Phos 33 - 130 U/L - 46 45  AST 10 - 35 U/L - 24 26  ALT 6 - 29 U/L - 16 24     RADIOGRAPHIC STUDIES: I have personally reviewed the radiological images as listed and agreed with the findings in the report. No results found.  ASSESSMENT & PLAN:  Chani L Ruffins is a 61 y.o. female with history of    1. PMS2 mutation heterozygous and Lynch syndrome  -I reviewed her genetic test results. -we discussed that this is a less common genetic mutations which causes Lynch syndrome. -patients with PMS 2 mutations carriers 15-20% lifetime risk of colon cancer by age of 70,  With the media age of onset in 60s, and 15% lifetime risk of endometrial cancer by age of 70, with media age of onset 60 years old. The risk of colon and endometrial cancer is lower than MLH1 or MSH2 or MSH6 mutation related Lynch syndrome.  -PMS2 mutation carrier also has slightly increased risk of extra colonic malignancy, such as stomach, ovary,  hepatobiliary track, urinary tract, small bowel, brain, and the combined risk for these malignancies is 6% to age 70. Routine screening for these malignancy are not established and not recommended. -I strongly recommend her to start screening colonoscopy, every one to two years, to detected earlier stage colon cancer or precancer lesions. We also discussed varous other screening tools such as CT colonography and stool test. After lengthy discussion, she agrees to have screening colonoscopy. I'll set up her GI referral to Dr. Jacobs. -I also discussed the benefit of low-dose aspirin to reduce risk of colon cancer. This has been showed in multiple clinical trials, but has not been the standard recommendation in the high-risk population like her. Side effects of aspirin were discussed with patient. She is not very interested.  -I also recommend prophylactic hysterectomy. She has no plan to have more children, and agrees to have this done through her gynecologist. Dr. McComb has recommend her surgery to be done endoscopically by a GYN oncologist, I will refer her to Dr. Rossi in our cancer center -I also strongly recommend her to continue screening mammogram, and consider breast fast MRI for screening due to her moderate risk of breast cancer.  Per the [facility] Myrisk estimation, she has 14% risk of breast cancer in her lifetime. -We also discussed the slightly increased risk of breast cancer from hormonal replacement, which she has been on for 9 years.  We discussed the benefits and potential risks of the hormone replacement, she is reluctant to come off, but will think about it. - I educated her about signs and symptoms of possible cancer, and she communicated understanding.  -We discussed that her children have 50% chance to carry the PMS2 mutation, I encouraged her children and siblings to be tested for PMS2. She agrees.   Plan -GI referral to Dr. Jacobs for colonoscopy every 1-2 years -she will continue  screening mammogram, I recommend her to consider breast fast MRI every 1-2 years also  -I will refer her to Dr. Rossi for   hysterectomy, and possible BSO -She will follow-up with her primary care physician and Dr. McComb, and only see me on as-needed basis in the future.   Orders Placed This Encounter  Procedures  . Ambulatory referral to Gastroenterology    Referral Priority:   Routine    Referral Type:   Consultation    Referral Reason:   Specialty Services Required    Number of Visits Requested:   1  . Ambulatory referral to Gynecologic Oncology    Referral Priority:   Routine    Referral Type:   Consultation    Referral Reason:   Specialty Services Required    Requested Specialty:   Oncology    Number of Visits Requested:   1    All questions were answered. The patient knows to call the clinic with any problems, questions or concerns. I spent 35 minutes counseling the patient face to face. The total time spent in the appointment was 45 minutes and more than 50% was on counseling.   I, Noor Dweik am acting as scribe for Dr. Yan Feng.  I have reviewed the above documentation for accuracy and completeness, and I agree with the above.    Yan Feng, MD 06/06/2018 7:10 PM   

## 2018-06-06 ENCOUNTER — Inpatient Hospital Stay: Payer: BLUE CROSS/BLUE SHIELD | Attending: Hematology | Admitting: Hematology

## 2018-06-06 ENCOUNTER — Encounter: Payer: Self-pay | Admitting: Hematology

## 2018-06-06 DIAGNOSIS — Z87891 Personal history of nicotine dependence: Secondary | ICD-10-CM | POA: Diagnosis not present

## 2018-06-06 DIAGNOSIS — Z809 Family history of malignant neoplasm, unspecified: Secondary | ICD-10-CM | POA: Diagnosis not present

## 2018-06-06 DIAGNOSIS — Z803 Family history of malignant neoplasm of breast: Secondary | ICD-10-CM

## 2018-06-06 DIAGNOSIS — Z1509 Genetic susceptibility to other malignant neoplasm: Secondary | ICD-10-CM | POA: Insufficient documentation

## 2018-06-07 ENCOUNTER — Telehealth: Payer: Self-pay | Admitting: Hematology

## 2018-06-07 NOTE — Telephone Encounter (Signed)
Referral sent through [facility]  and In basket message sent to [facility] for referral to Dr Denman George per 7/23 los

## 2018-06-09 ENCOUNTER — Telehealth: Payer: Self-pay | Admitting: *Deleted

## 2018-06-09 NOTE — Telephone Encounter (Signed)
Called and left the patient a message to call the office back. Need to schedule the patient for a new appt with Dr.Phelps

## 2018-06-12 ENCOUNTER — Telehealth: Payer: Self-pay | Admitting: *Deleted

## 2018-06-12 NOTE — Telephone Encounter (Signed)
Patient called to schedule new patient appt with the office. Patient stated that "Am I seeing Dr. Denman George?" Explained that Dr. Denman George will be out of the office for couple weeks. You would be schedule to see Dr. Gerarda Fraction. One of Dr. Denman George partners." Patient asked "Is Dr.Rossi the head of the practice? Would I see Dr.Phelps and Dr.Rossi do my surgery, how does that work." I explained that "if you see Dr.Phelps, she would do you surgery. But Dr. Denman George and Dr.Phelps are partners, work side by side." Patient asked "Does that mean they will do my surgery together?" Explained "No it would either be Dr. Gerarda Fraction or Dr. Denman George with the surgery assistant Dr.Jackson-Moore." Patient stated "Well can I do some research on this first. I'm dragging my feet. It's a lot to take." I relied "that is fine and understandable. The first available for Dr. Denman George is September 13 th. I will research and if I want to see Dr. Gerarda Fraction earlier I can call." Relied that "that is fine, appt for Dr. Denman George is September 13th at 9am. Please arrive by 8:30 to register."

## 2018-07-03 ENCOUNTER — Other Ambulatory Visit: Payer: BLUE CROSS/BLUE SHIELD

## 2018-07-03 ENCOUNTER — Encounter: Payer: BLUE CROSS/BLUE SHIELD | Admitting: Genetics

## 2018-07-27 ENCOUNTER — Encounter: Payer: Self-pay | Admitting: Hematology

## 2018-07-28 ENCOUNTER — Inpatient Hospital Stay: Payer: BLUE CROSS/BLUE SHIELD | Attending: Hematology | Admitting: Gynecologic Oncology

## 2018-07-28 ENCOUNTER — Other Ambulatory Visit (HOSPITAL_COMMUNITY)
Admission: RE | Admit: 2018-07-28 | Discharge: 2018-07-28 | Disposition: A | Discharge status: Home / Self Care | Payer: BLUE CROSS/BLUE SHIELD | Source: Ambulatory Visit | Attending: Gynecologic Oncology | Admitting: Gynecologic Oncology

## 2018-07-28 ENCOUNTER — Encounter: Payer: Self-pay | Admitting: Gynecologic Oncology

## 2018-07-28 VITALS — BP 122/81 | HR 51 | Temp 98.5°F | Resp 19 | Ht 67.0 in | Wt 174.4 lb

## 2018-07-28 DIAGNOSIS — Z1509 Genetic susceptibility to other malignant neoplasm: Secondary | ICD-10-CM | POA: Insufficient documentation

## 2018-07-28 DIAGNOSIS — Z87891 Personal history of nicotine dependence: Secondary | ICD-10-CM | POA: Diagnosis not present

## 2018-07-28 NOTE — Progress Notes (Signed)
Consult Note: Gyn-Onc  Consult was requested by Dr. Burr Medico for the evaluation of Brandy Henson 62 y.o. female  CC:  Chief Complaint  Patient presents with  . Lynch syndrome    Assessment/Plan:  Brandy Henson  is a 62 y.o.  year old with Lynch syndrome (PSM2 mutation positive).  A lengthy discussion with Brandy Henson regarding the etiology of Lynch syndrome and its role in causing increased risk for cancer.  I discussed the increased risk for ovarian and endometrial cancer that are associated with this mutation.  I discussed that postmenopausal women I recommended to undergo risk reducing total hysterectomy with BSO.  Brandy Henson is a good surgical candidate and I think would be a good candidate for robotic assisted approach.  She has not yet had a screening colonoscopy for approximately 10 years and is reaching out to her gastroenterologist to facilitate this.  If she is able to have colonoscopy prior to the procedure this will minimize the chance of her requiring a second surgical intervention if a colon cancer is found.  I discussed the risks benefits and alternatives to surgical intervention including serial screening with ultrasound and endometrial sampling which is not been proven to identify cancers at an earlier will curable stage.  Brandy Henson is desiring definitive surgery however will reach back out to our office regarding the timing when she has spoken with her gastroenterologist.   HPI: Brandy Henson is a 62 year old P4 who was seen in consultation at the request of Dr.Feng for a newly diagnosed Lynch syndrome with a PSM2 Tatian.  The patient's does not have a personal history of cancer however due to her family history which includes a maternal grandmother with breast cancer diagnosed in her 11s and a paternal grandmother with cancer in her 71s this triggered her gynecologist Dr. Renold Don to order genetic screening.  This screening was performed and identified a mutation in PSM 2.  A transvaginal  ultrasound in June 2019 revealed an enlarged fibroid uterus measuring 12.1 x 6.7 x 9 cm.  The endometrium was 7 mm thick.  The ovaries were not visualized due to the fibroids but no adnexal masses was seen.  She is otherwise healthy with no prior abdominal surgeries other than pelvic laparoscopy.   She had an endometrial ablation in 2006 with benign sampling (per patient).   The patient has a history of normal paps (last >2years ago).   Current Meds:  Outpatient Encounter Medications as of 07/28/2018  Medication Sig  . estradiol (MINIVELLE) 0.075 MG/24HR APPLY 1 PATCH ONTO THE SKIN TWICE WEEKLY  . Multiple Vitamin (MULTIVITAMIN) tablet Take 1 tablet by mouth daily.  . progesterone (PROMETRIUM) 100 MG capsule Take 1 capsule (100 mg total) by mouth at bedtime.  . RESTASIS 0.05 % ophthalmic emulsion Take 1 tablet by mouth as needed.   . Ascorbic Acid (VITAMIN C PO) Take 1 tablet by mouth daily.   . Cholecalciferol (VITAMIN D PO) Take 1 tablet by mouth daily.   Marland Kitchen MINIVELLE 0.075 MG/24HR APPLY 1 PATCH ON THE SKIN TWICE WEEKLY (Patient not taking: Reported on 06/06/2018)  . Omega-3 Fatty Acids (OMEGA 3 PO) Take 1 tablet by mouth daily.   . [DISCONTINUED] neomycin-colistin-hydrocortisone-thonzonium (CORTISPORIN TC) 3.01-15-09-0.5 MG/ML otic suspension 4 (four) times daily.   No facility-administered encounter medications on file as of 07/28/2018.     Allergy:  Allergies  Allergen Reactions  . Erythromycin Other (See Comments)    Stomach upset    Social Hx:   Social History  Socioeconomic History  . Marital status: Married    Spouse name: Not on file  . Number of children: Not on file  . Years of education: Not on file  . Highest education level: Not on file  Occupational History  . Not on file  Social Needs  . Financial resource strain: Not on file  . Food insecurity:    Worry: Not on file    Inability: Not on file  . Transportation needs:    Medical: Not on file    Non-medical:  Not on file  Tobacco Use  . Smoking status: Former Smoker    Years: 2.00  . Smokeless tobacco: Never Used  Substance and Sexual Activity  . Alcohol use: Yes    Comment: Social  . Drug use: No  . Sexual activity: Yes    Birth control/protection: Post-menopausal  Lifestyle  . Physical activity:    Days per week: Not on file    Minutes per session: Not on file  . Stress: Not on file  Relationships  . Social connections:    Talks on phone: Not on file    Gets together: Not on file    Attends religious service: Not on file    Active member of club or organization: Not on file    Attends meetings of clubs or organizations: Not on file    Relationship status: Not on file  . Intimate partner violence:    Fear of current or ex partner: Not on file    Emotionally abused: Not on file    Physically abused: Not on file    Forced sexual activity: Not on file  Other Topics Concern  . Not on file  Social History Narrative  . Not on file    Past Surgical Hx:  Past Surgical History:  Procedure Laterality Date  . AUGMENTATION MAMMAPLASTY    . ELBOW SURGERY    . ENDOMETRIAL ABLATION  2006  . KNEE SURGERY Right 2010  . KNEE SURGERY Bilateral   . PELVIC LAPAROSCOPY      Past Medical Hx:  Past Medical History:  Diagnosis Date  . Arthritis     Past Gynecological History:  Endometrial ablation. No LMP recorded. Patient has had an ablation.  Family Hx:  Family History  Problem Relation Age of Onset  . Hypertension Father   . Diabetes Father   . Dementia Father   . Breast cancer Maternal Grandmother 74  . Diabetes Mother   . Hypertension Mother   . Cancer Paternal Grandmother        ? Uterine or ovarian    Review of Systems:  Constitutional  Feels well,    ENT Normal appearing ears and nares bilaterally Skin/Breast  No rash, sores, jaundice, itching, dryness Cardiovascular  No chest pain, shortness of breath, or edema  Pulmonary  No cough or wheeze.  Gastro  Intestinal  No nausea, vomitting, or diarrhoea. No bright red blood per rectum, no abdominal pain, change in bowel movement, or constipation.  Genito Urinary  No frequency, urgency, dysuria, no bleeding Musculo Skeletal  No myalgia, arthralgia, joint swelling or pain  Neurologic  No weakness, numbness, change in gait,  Psychology  No depression, anxiety, insomnia.   Vitals:  Blood pressure 122/81, pulse (!) 51, temperature 98.5 F (36.9 C), temperature source Oral, resp. rate 19, height 5\' 7"  (1.702 m), weight 174 lb 6.4 oz (79.1 kg), SpO2 100 %.  Physical Exam: WD in NAD Neck  Supple NROM, without any enlargements.  Lymph Node Survey No cervical supraclavicular or inguinal adenopathy Cardiovascular  Pulse normal rate, regularity and rhythm. S1 and S2 normal.  Lungs  Clear to auscultation bilateraly, without wheezes/crackles/rhonchi. Good air movement.  Skin  No rash/lesions/breakdown  Psychiatry  Alert and oriented to person, place, and time  Abdomen  Normoactive bowel sounds, abdomen soft, non-tender and thin without evidence of hernia.  Back No CVA tenderness Genito Urinary  Vulva/vagina: Normal external female genitalia.   No lesions. No discharge or bleeding.  Bladder/urethra:  No lesions or masses, well supported bladder  Vagina: normal  Cervix: Normal appearing, no lesions. PAP TAKEN   Uterus: slightly globular,  mobile, no parametrial involvement or nodularity.  Adnexa: no palpabe masses. Rectal  deferred Extremities  No bilateral cyanosis, clubbing or edema.  PROCEDURE NOTE: endoemtrial biopsy preop dx: Lynch Postop dx: same Procedure: endo biopsy Surgeon: Denman George EBL: none Complications: unable to penetrate endoemtrial cavity due to stenosis. Procedure: Patient provided verbal consent and timeout was performed the cervix was visualized and grasped with a tenaculum and the endometrial Pipelle was inserted but could only passed a 5 cm death due to cervical and  endometrial stenosis consistent with her prior ablation.  Sampling was obtained with one pass.  Patient tolerated procedure well.  No bleeding ensued.  Thereasa Solo, MD  07/28/2018, 5:42 PM

## 2018-07-28 NOTE — Patient Instructions (Signed)
Preparing for your Surgery Call Dr. Serita Grit office when you are ready to schedule surgery. Office [phone]. You will be having a Robotic Hysterectomy with a Bilateral Salpingo-oophorectomy  Pre-operative Testing -You will receive a phone call from presurgical testing at [facility] to arrange for a pre-operative testing appointment before your surgery.  This appointment normally occurs one to two weeks before your scheduled surgery.   -Bring your insurance card, copy of an advanced directive if applicable, medication list  -At that visit, you will be asked to sign a consent for a possible blood transfusion in case a transfusion becomes necessary during surgery.  The need for a blood transfusion is rare but having consent is a necessary part of your care.     -You should not be taking blood thinners or aspirin at least ten days prior to surgery unless instructed by your surgeon.  Day Before Surgery at [place] will be asked to take in a light diet the day before surgery.  Avoid carbonated beverages.  You will be advised to have nothing to eat or drink after midnight the evening before.    Eat a light diet the day before surgery.  Examples including soups, broths, toast, yogurt, mashed potatoes.  Things to avoid include carbonated beverages  (fizzy beverages), raw fruits and raw vegetables, or beans.   If your bowels are filled with gas, your surgeon will have difficulty visualizing your pelvic organs which increases your surgical risks.  Your role in recovery Your role is to become active as soon as directed by your doctor, while still giving yourself time to heal.  Rest when you feel tired. You will be asked to do the following in order to speed your recovery:  - Cough and breathe deeply. This helps toclear and expand your lungs and can prevent pneumonia. You may be given a spirometer to practice deep breathing. A staff member will show you how to use the  spirometer. - Do mild physical activity. Walking or moving your legs help your circulation and body functions return to normal. A staff member will help you when you try to walk and will provide you with simple exercises. Do not try to get up or walk alone the first time. - Actively manage your pain. Managing your pain lets you move in comfort. We will ask you to rate your pain on a scale of zero to 10. It is your responsibility to tell your doctor or nurse where and how much you hurt so your pain can be treated.  Special Considerations -If you are diabetic, you may be placed on insulin after surgery to have closer control over your blood sugars to promote healing and recovery.  This does not mean that you will be discharged on insulin.  If applicable, your oral antidiabetics will be resumed when you are tolerating a solid diet.  -Your final pathology results from surgery should be available by the Friday after surgery and the results will be relayed to you when available.  -Dr. Lahoma Crocker is the Surgeon that assists your GYN Oncologist with surgery.  The next day after your surgery you will either see your GYN Oncologist or Dr. Lahoma Crocker.  Eat a light diet the day before surgery.  Examples including soups, broths, toast, yogurt, mashed potatoes.  Things to avoid include carbonated beverages (fizzy beverages), raw fruits and raw vegetables, or beans.   If your bowels are filled with gas, your surgeon will have difficulty visualizing your pelvic organs  which increases your surgical risks.   Blood Transfusion Information WHAT IS A BLOOD TRANSFUSION? A transfusion is the replacement of blood or some of its parts. Blood is made up of multiple cells which provide different functions.  Red blood cells carry oxygen and are used for blood loss replacement.  White blood cells fight against infection.  Platelets control bleeding.  Plasma helps clot blood.  Other blood products are  available for specialized needs, such as hemophilia or other clotting disorders. BEFORE THE TRANSFUSION  Who gives blood for transfusions?   You may be able to donate blood to be used at a later date on yourself (autologous donation).  Relatives can be asked to donate blood. This is generally not any safer than if you have received blood from a stranger. The same precautions are taken to ensure safety when a relative's blood is donated.  Healthy volunteers who are fully evaluated to make sure their blood is safe. This is blood bank blood. Transfusion therapy is the safest it has ever been in the practice of medicine. Before blood is taken from a donor, a complete history is taken to make sure that person has no history of diseases nor engages in risky social behavior (examples are intravenous drug use or sexual activity with multiple partners). The donor's travel history is screened to minimize risk of transmitting infections, such as malaria. The donated blood is tested for signs of infectious diseases, such as HIV and hepatitis. The blood is then tested to be sure it is compatible with you in order to minimize the chance of a transfusion reaction. If you or a relative donates blood, this is often done in anticipation of surgery and is not appropriate for emergency situations. It takes many days to process the donated blood. RISKS AND COMPLICATIONS Although transfusion therapy is very safe and saves many lives, the main dangers of transfusion include:   Getting an infectious disease.  Developing a transfusion reaction. This is an allergic reaction to something in the blood you were given. Every precaution is taken to prevent this. The decision to have a blood transfusion has been considered carefully by your caregiver before blood is given. Blood is not given unless the benefits outweigh the risks.

## 2018-08-01 ENCOUNTER — Telehealth: Payer: Self-pay

## 2018-08-01 LAB — CYTOLOGY - PAP
Diagnosis: NEGATIVE
HPV: NOT DETECTED

## 2018-08-01 NOTE — Telephone Encounter (Signed)
Outgoing call per Joylene John NP regarding surgical path report - results as "benign."  No answer at home or cell number - left VM to call our office.

## 2018-08-01 NOTE — Telephone Encounter (Signed)
Pt called back for her results of surgical path from 07-28-18 - results given as "Benign" "no cancer" - pt voiced understanding and no other needs per pt at this time.

## 2018-08-02 ENCOUNTER — Telehealth: Payer: Self-pay

## 2018-08-02 NOTE — Telephone Encounter (Signed)
L/M for Brandy Henson that Dr. Denman George received the [place] report from Dr. Sherran Needs ofiice from 04-25-18.  She does not need to repeat an [place] prior to surgery.

## 2018-10-09 ENCOUNTER — Telehealth: Payer: Self-pay | Admitting: Emergency Medicine

## 2018-10-09 NOTE — Telephone Encounter (Signed)
Patient called to wanting to inform Dr.Rossi that she had a endoscopy and colonoscopy. Fax number given to patient for report to be sent to [place]. Patient also has questions in reference to her robotic hysterectomy that she needs to have scheduled.  I informed her that Joylene John, NP may be able to call her back in regards to these questions. Will inform Melissa of this. Pt verbalized understanding and phone number verified.

## 2018-10-10 ENCOUNTER — Telehealth: Payer: Self-pay | Admitting: Gynecologic Oncology

## 2018-10-10 NOTE — Telephone Encounter (Signed)
Returned call to patient.  All questions answered related to surgery.  Patient would like to proceed with surgery on Dec 12 with Dr. Denman George.  Advised she would be contacted from the hospital to arrange for a pre-op appointment. Advised to call for any needs.

## 2018-10-18 NOTE — Patient Instructions (Signed)
Brandy Henson  10/18/2018   Your procedure is scheduled on: 10-26-18  Report to [facility] Main  Entrance  Report to admitting at     0530 AM    Call this number if you have problems the morning of surgery [phone]    Remember: Eat a light diet the day before surgery.  Examples including soups, broths, toast, yogurt, mashed potatoes.  Things to avoid include carbonated beverages (fizzy beverages), raw fruits and raw vegetables, or beans.   If your bowels are filled with gas, your surgeon will have difficulty visualizing your pelvic organs which increases your surgical risks.  NO SOLID FOOD AFTER MIDNIGHT THE NIGHT PRIOR TO SURGERY. NOTHING BY MOUTH EXCEPT CLEAR LIQUIDS UNTIL 3 HOURS PRIOR TO [place] SURGERY. PLEASE FINISH ENSURE DRINK PER SURGEON ORDER 3 HOURS PRIOR TO SCHEDULED SURGERY TIME WHICH NEEDS TO BE COMPLETED AT ____0430 am then nothing by mouth________.    BRUSH YOUR TEETH MORNING OF SURGERY AND RINSE YOUR MOUTH OUT, NO CHEWING GUM CANDY OR MINTS.     Take these medicines the morning of surgery with A SIP OF WATER: eye drops if needed                                You may not have any metal on your body including hair pins and              piercings  Do not wear jewelry, make-up, lotions, powders or perfumes, deodorant             Do not wear nail polish.  Do not shave  48 hours prior to surgery.     Do not bring valuables to the hospital. [place].  Contacts, dentures or bridgework may not be worn into surgery.      Patients discharged the day of surgery will not be allowed to drive home.  Name and phone number of your driver:  Special Instructions: N/A              Please read over the following fact sheets you were given: _____________________________________________________________________          [facility] - Preparing for Surgery Before surgery, you can play an important  role.  Because skin is not sterile, your skin needs to be as free of germs as possible.  You can reduce the number of germs on your skin by washing with CHG (chlorahexidine gluconate) soap before surgery.  CHG is an antiseptic cleaner which kills germs and bonds with the skin to continue killing germs even after washing. Please DO NOT use if you have an allergy to CHG or antibacterial soaps.  If your skin becomes reddened/irritated stop using the CHG and inform your nurse when you arrive at Short Stay. Do not shave (including legs and underarms) for at least 48 hours prior to the first CHG shower.  You may shave your face/neck. Please follow these instructions carefully:  1.  Shower with CHG Soap the night before surgery and the  morning of Surgery.  2.  If you choose to wash your hair, wash your hair first as usual with your  normal  shampoo.  3.  After you shampoo, rinse your hair and body  thoroughly to remove the  shampoo.                           4.  Use CHG as you would any other liquid soap.  You can apply chg directly  to the skin and wash                       Gently with a scrungie or clean washcloth.  5.  Apply the CHG Soap to your body ONLY FROM THE NECK DOWN.   Do not use on face/ open                           Wound or open sores. Avoid contact with eyes, ears mouth and genitals (private parts).                       Wash face,  Genitals (private parts) with your normal soap.             6.  Wash thoroughly, paying special attention to the area where your surgery  will be performed.  7.  Thoroughly rinse your body with warm water from the neck down.  8.  DO NOT shower/wash with your normal soap after using and rinsing off  the CHG Soap.                9.  Pat yourself dry with a clean towel.            10.  Wear clean pajamas.            11.  Place clean sheets on your bed the night of your first shower and do not  sleep with pets. Day of Surgery : Do not apply any lotions/deodorants  the morning of surgery.  Please wear clean clothes to the hospital/surgery center.  FAILURE TO FOLLOW THESE INSTRUCTIONS MAY RESULT IN THE CANCELLATION OF YOUR SURGERY PATIENT SIGNATURE_________________________________  NURSE SIGNATURE__________________________________  ________________________________________________________________________  WHAT IS A BLOOD TRANSFUSION? Blood Transfusion Information  A transfusion is the replacement of blood or some of its parts. Blood is made up of multiple cells which provide different functions.  Red blood cells carry oxygen and are used for blood loss replacement.  White blood cells fight against infection.  Platelets control bleeding.  Plasma helps clot blood.  Other blood products are available for specialized needs, such as hemophilia or other clotting disorders. BEFORE THE TRANSFUSION  Who gives blood for transfusions?   Healthy volunteers who are fully evaluated to make sure their blood is safe. This is blood bank blood. Transfusion therapy is the safest it has ever been in the practice of medicine. Before blood is taken from a donor, a complete history is taken to make sure that person has no history of diseases nor engages in risky social behavior (examples are intravenous drug use or sexual activity with multiple partners). The donor's travel history is screened to minimize risk of transmitting infections, such as malaria. The donated blood is tested for signs of infectious diseases, such as HIV and hepatitis. The blood is then tested to be sure it is compatible with you in order to minimize the chance of a transfusion reaction. If you or a relative donates blood, this is often done in anticipation of surgery and is not appropriate for emergency situations. It takes many days to process the donated  blood. RISKS AND COMPLICATIONS Although transfusion therapy is very safe and saves many lives, the main dangers of transfusion include:    Getting an infectious disease.  Developing a transfusion reaction. This is an allergic reaction to something in the blood you were given. Every precaution is taken to prevent this. The decision to have a blood transfusion has been considered carefully by your caregiver before blood is given. Blood is not given unless the benefits outweigh the risks. AFTER THE TRANSFUSION  Right after receiving a blood transfusion, you will usually feel much better and more energetic. This is especially true if your red blood cells have gotten low (anemic). The transfusion raises the level of the red blood cells which carry oxygen, and this usually causes an energy increase.  The nurse administering the transfusion will monitor you carefully for complications. HOME CARE INSTRUCTIONS  No special instructions are needed after a transfusion. You may find your energy is better. Speak with your caregiver about any limitations on activity for underlying diseases you may have. SEEK MEDICAL CARE IF:   Your condition is not improving after your transfusion.  You develop redness or irritation at the intravenous (IV) site. SEEK IMMEDIATE MEDICAL CARE IF:  Any of the following symptoms occur over the next 12 hours:  Shaking chills.  You have a temperature by mouth above 102 F (38.9 C), not controlled by medicine.  Chest, back, or muscle pain.  People around you feel you are not acting correctly or are confused.  Shortness of breath or difficulty breathing.  Dizziness and fainting.  You get a rash or develop hives.  You have a decrease in urine output.  Your urine turns a dark color or changes to pink, red, or brown. Any of the following symptoms occur over the next 10 days:  You have a temperature by mouth above 102 F (38.9 C), not controlled by medicine.  Shortness of breath.  Weakness after normal activity.  The white part of the eye turns yellow (jaundice).  You have a decrease in the  amount of urine or are urinating less often.  Your urine turns a dark color or changes to pink, red, or brown. Document Released: 10/29/2000 Document Revised: 01/24/2012 Document Reviewed: 06/17/2008 ExitCare Patient Information 2014 [place].  _______________________________________________________________________  Incentive Spirometer  An incentive spirometer is a tool that can help keep your lungs clear and active. This tool measures how well you are filling your lungs with each breath. Taking long deep breaths may help reverse or decrease the chance of developing breathing (pulmonary) problems (especially infection) following:  A long period of time when you are unable to move or be active. BEFORE THE PROCEDURE   If the spirometer includes an indicator to show your best effort, your nurse or respiratory therapist will set it to a desired goal.  If possible, sit up straight or lean slightly forward. Try not to slouch.  Hold the incentive spirometer in an upright position. INSTRUCTIONS FOR USE  1. Sit on the edge of your bed if possible, or sit up as far as you can in bed or on a chair. 2. Hold the incentive spirometer in an upright position. 3. Breathe out normally. 4. Place the mouthpiece in your mouth and seal your lips tightly around it. 5. Breathe in slowly and as deeply as possible, raising the piston or the ball toward the top of the column. 6. Hold your breath for 3-5 seconds or for as long as possible. Allow the  piston or ball to fall to the bottom of the column. 7. Remove the mouthpiece from your mouth and breathe out normally. 8. Rest for a few seconds and repeat Steps 1 through 7 at least 10 times every 1-2 hours when you are awake. Take your time and take a few normal breaths between deep breaths. 9. The spirometer may include an indicator to show your best effort. Use the indicator as a goal to work toward during each repetition. 10. After each set of 10 deep  breaths, practice coughing to be sure your lungs are clear. If you have an incision (the cut made at the time of surgery), support your incision when coughing by placing a pillow or rolled up towels firmly against it. Once you are able to get out of bed, walk around indoors and cough well. You may stop using the incentive spirometer when instructed by your caregiver.  RISKS AND COMPLICATIONS  Take your time so you do not get dizzy or light-headed.  If you are in pain, you may need to take or ask for pain medication before doing incentive spirometry. It is harder to take a deep breath if you are having pain. AFTER USE  Rest and breathe slowly and easily.  It can be helpful to keep track of a log of your progress. Your caregiver can provide you with a simple table to help with this. If you are using the spirometer at home, follow these instructions: [place] IF:   You are having difficultly using the spirometer.  You have trouble using the spirometer as often as instructed.  Your pain medication is not giving enough relief while using the spirometer.  You develop fever of 100.5 F (38.1 C) or higher. SEEK IMMEDIATE MEDICAL CARE IF:   You cough up bloody sputum that had not been present before.  You develop fever of 102 F (38.9 C) or greater.  You develop worsening pain at or near the incision site. MAKE SURE YOU:   Understand these instructions.  Will watch your condition.  Will get help right away if you are not doing well or get worse. Document Released: 03/14/2007 Document Revised: 01/24/2012 Document Reviewed: 05/15/2007 [facility] Patient Information 2014 [place], [place].   ________________________________________________________________________

## 2018-10-19 ENCOUNTER — Encounter (HOSPITAL_COMMUNITY): Payer: Self-pay

## 2018-10-19 ENCOUNTER — Encounter (HOSPITAL_COMMUNITY)
Admission: RE | Admit: 2018-10-19 | Discharge: 2018-10-19 | Disposition: A | Discharge status: Home / Self Care | Payer: BLUE CROSS/BLUE SHIELD | Source: Ambulatory Visit | Attending: Gynecologic Oncology | Admitting: Gynecologic Oncology

## 2018-10-19 ENCOUNTER — Other Ambulatory Visit: Payer: Self-pay

## 2018-10-19 DIAGNOSIS — Z1509 Genetic susceptibility to other malignant neoplasm: Secondary | ICD-10-CM | POA: Diagnosis not present

## 2018-10-19 DIAGNOSIS — Z01818 Encounter for other preprocedural examination: Secondary | ICD-10-CM | POA: Insufficient documentation

## 2018-10-19 HISTORY — DX: Pneumonia, unspecified organism: J18.9

## 2018-10-19 LAB — CBC
HCT: 41.7 % (ref 36.0–46.0)
Hemoglobin: 13.2 g/dL (ref 12.0–15.0)
MCH: 31.3 pg (ref 26.0–34.0)
MCHC: 31.7 g/dL (ref 30.0–36.0)
MCV: 98.8 fL (ref 80.0–100.0)
Platelets: 204 10*3/uL (ref 150–400)
RBC: 4.22 MIL/uL (ref 3.87–5.11)
RDW: 13.2 % (ref 11.5–15.5)
WBC: 5.1 10*3/uL (ref 4.0–10.5)
nRBC: 0 % (ref 0.0–0.2)

## 2018-10-19 LAB — COMPREHENSIVE METABOLIC PANEL
ALT: 25 U/L (ref 0–44)
AST: 31 U/L (ref 15–41)
Albumin: 4 g/dL (ref 3.5–5.0)
Alkaline Phosphatase: 52 U/L (ref 38–126)
Anion gap: 9 (ref 5–15)
BUN: 20 mg/dL (ref 6–20)
CO2: 25 mmol/L (ref 22–32)
Calcium: 8.8 mg/dL — ABNORMAL LOW (ref 8.9–10.3)
Chloride: 106 mmol/L (ref 98–111)
Creatinine, Ser: 0.93 mg/dL (ref 0.44–1.00)
GFR calc Af Amer: >60 mL/min (ref >60)
GFR calc non Af Amer: >60 mL/min (ref >60)
Glucose, Bld: 75 mg/dL (ref 70–99)
Potassium: 4.5 mmol/L (ref 3.5–5.1)
Sodium: 140 mmol/L (ref 135–145)
Total Bilirubin: 0.6 mg/dL (ref 0.3–1.2)
Total Protein: 6.8 g/dL (ref 6.5–8.1)

## 2018-10-19 LAB — URINALYSIS, ROUTINE W REFLEX MICROSCOPIC
Bilirubin Urine: NEGATIVE
Glucose, UA: NEGATIVE mg/dL
Hgb urine dipstick: NEGATIVE
Ketones, ur: NEGATIVE mg/dL
Leukocytes, UA: NEGATIVE
Nitrite: NEGATIVE
Protein, ur: NEGATIVE mg/dL
Specific Gravity, Urine: 1.003 — ABNORMAL LOW (ref 1.005–1.030)
pH: 6 (ref 5.0–8.0)

## 2018-10-19 LAB — ABO/RH: ABO/RH(D): A POS

## 2018-10-26 ENCOUNTER — Encounter (HOSPITAL_COMMUNITY)
Admission: RE | Disposition: A | Discharge status: Home / Self Care | Payer: Self-pay | Source: Ambulatory Visit | Attending: Gynecologic Oncology

## 2018-10-26 ENCOUNTER — Ambulatory Visit (HOSPITAL_COMMUNITY)
Admission: RE | Admit: 2018-10-26 | Discharge: 2018-10-26 | Disposition: A | Discharge status: Home / Self Care | Payer: BLUE CROSS/BLUE SHIELD | Source: Ambulatory Visit | Attending: Gynecologic Oncology | Admitting: Gynecologic Oncology

## 2018-10-26 ENCOUNTER — Ambulatory Visit (HOSPITAL_COMMUNITY): Payer: BLUE CROSS/BLUE SHIELD | Admitting: Anesthesiology

## 2018-10-26 ENCOUNTER — Encounter (HOSPITAL_COMMUNITY): Payer: Self-pay | Admitting: *Deleted

## 2018-10-26 DIAGNOSIS — Z79899 Other long term (current) drug therapy: Secondary | ICD-10-CM | POA: Insufficient documentation

## 2018-10-26 DIAGNOSIS — N888 Other specified noninflammatory disorders of cervix uteri: Secondary | ICD-10-CM | POA: Diagnosis not present

## 2018-10-26 DIAGNOSIS — N800 Endometriosis of uterus: Secondary | ICD-10-CM | POA: Insufficient documentation

## 2018-10-26 DIAGNOSIS — N838 Other noninflammatory disorders of ovary, fallopian tube and broad ligament: Secondary | ICD-10-CM | POA: Diagnosis not present

## 2018-10-26 DIAGNOSIS — D259 Leiomyoma of uterus, unspecified: Secondary | ICD-10-CM

## 2018-10-26 DIAGNOSIS — Z87891 Personal history of nicotine dependence: Secondary | ICD-10-CM | POA: Diagnosis not present

## 2018-10-26 DIAGNOSIS — Z1509 Genetic susceptibility to other malignant neoplasm: Secondary | ICD-10-CM | POA: Diagnosis not present

## 2018-10-26 HISTORY — PX: ROBOTIC ASSISTED TOTAL HYSTERECTOMY WITH BILATERAL SALPINGO OOPHERECTOMY: SHX6086

## 2018-10-26 LAB — TYPE AND SCREEN
ABO/RH(D): A POS
Antibody Screen: NEGATIVE

## 2018-10-26 SURGERY — HYSTERECTOMY, TOTAL, ROBOT-ASSISTED, LAPAROSCOPIC, WITH BILATERAL SALPINGO-OOPHORECTOMY
Anesthesia: General | Laterality: Bilateral

## 2018-10-26 MED ORDER — OXYCODONE HCL 5 MG PO TABS
5.0000 mg | ORAL_TABLET | ORAL | Status: DC | PRN
  Administered 2018-10-26: 5 mg via ORAL

## 2018-10-26 MED ORDER — MORPHINE SULFATE (PF) 4 MG/ML IV SOLN: 2.0000 mg | INTRAVENOUS | Status: DC | PRN

## 2018-10-26 MED ORDER — MIDAZOLAM HCL 5 MG/5ML IJ SOLN
INTRAMUSCULAR | Status: DC | PRN
  Administered 2018-10-26: 2 mg via INTRAVENOUS

## 2018-10-26 MED ORDER — BUPIVACAINE LIPOSOME 1.3 % IJ SUSP
20.0000 mL | Freq: Once | INTRAMUSCULAR | Status: AC
  Administered 2018-10-26: 20 mL
  Filled 2018-10-26: qty 20

## 2018-10-26 MED ORDER — FENTANYL CITRATE (PF) 100 MCG/2ML IJ SOLN
INTRAMUSCULAR | Status: DC | PRN
  Administered 2018-10-26: 50 ug via INTRAVENOUS
  Administered 2018-10-26: 100 ug via INTRAVENOUS
  Administered 2018-10-26: 50 ug via INTRAVENOUS

## 2018-10-26 MED ORDER — SUGAMMADEX SODIUM 200 MG/2ML IV SOLN
INTRAVENOUS | Status: DC | PRN
  Administered 2018-10-26: 175 mg via INTRAVENOUS

## 2018-10-26 MED ORDER — KETOROLAC TROMETHAMINE 15 MG/ML IJ SOLN
INTRAMUSCULAR | Status: AC
  Filled 2018-10-26: qty 1

## 2018-10-26 MED ORDER — HYDROMORPHONE HCL 1 MG/ML IJ SOLN
0.2500 mg | INTRAMUSCULAR | Status: DC | PRN
  Administered 2018-10-26 (×2): 0.5 mg via INTRAVENOUS

## 2018-10-26 MED ORDER — ACETAMINOPHEN 325 MG PO TABS: 650.0000 mg | ORAL_TABLET | ORAL | Status: DC | PRN

## 2018-10-26 MED ORDER — GABAPENTIN 300 MG PO CAPS
300.0000 mg | ORAL_CAPSULE | ORAL | Status: AC
  Administered 2018-10-26: 300 mg via ORAL
  Filled 2018-10-26: qty 1

## 2018-10-26 MED ORDER — FENTANYL CITRATE (PF) 250 MCG/5ML IJ SOLN
INTRAMUSCULAR | Status: AC
  Filled 2018-10-26: qty 5

## 2018-10-26 MED ORDER — MIDAZOLAM HCL 2 MG/2ML IJ SOLN
INTRAMUSCULAR | Status: AC
  Filled 2018-10-26: qty 2

## 2018-10-26 MED ORDER — EPHEDRINE SULFATE 50 MG/ML IJ SOLN
INTRAMUSCULAR | Status: DC | PRN
  Administered 2018-10-26 (×3): 10 mg via INTRAVENOUS

## 2018-10-26 MED ORDER — PROPOFOL 10 MG/ML IV BOLUS
INTRAVENOUS | Status: AC
  Filled 2018-10-26: qty 20

## 2018-10-26 MED ORDER — OXYCODONE HCL 5 MG PO TABS: 5.0000 mg | ORAL_TABLET | Freq: Once | ORAL | Status: DC | PRN

## 2018-10-26 MED ORDER — LIDOCAINE 20MG/ML (2%) 15 ML SYRINGE OPTIME
INTRAMUSCULAR | Status: DC | PRN
  Administered 2018-10-26: 1.5 mg/kg/h via INTRAVENOUS

## 2018-10-26 MED ORDER — PROPOFOL 10 MG/ML IV BOLUS
INTRAVENOUS | Status: DC | PRN
  Administered 2018-10-26: 150 mg via INTRAVENOUS

## 2018-10-26 MED ORDER — MEPERIDINE HCL 50 MG/ML IJ SOLN: 6.2500 mg | INTRAMUSCULAR | Status: DC | PRN

## 2018-10-26 MED ORDER — LACTATED RINGERS IR SOLN
Status: DC | PRN
  Administered 2018-10-26: 1

## 2018-10-26 MED ORDER — KETAMINE HCL 10 MG/ML IJ SOLN
INTRAMUSCULAR | Status: DC | PRN
  Administered 2018-10-26: 30 mg via INTRAVENOUS

## 2018-10-26 MED ORDER — KETAMINE HCL 10 MG/ML IJ SOLN
INTRAMUSCULAR | Status: AC
  Filled 2018-10-26: qty 1

## 2018-10-26 MED ORDER — KETOROLAC TROMETHAMINE 15 MG/ML IJ SOLN
15.0000 mg | Freq: Four times a day (QID) | INTRAMUSCULAR | Status: DC
  Administered 2018-10-26: 15 mg via INTRAVENOUS

## 2018-10-26 MED ORDER — ACETAMINOPHEN 500 MG PO TABS
1000.0000 mg | ORAL_TABLET | ORAL | Status: AC
  Administered 2018-10-26: 1000 mg via ORAL
  Filled 2018-10-26: qty 2

## 2018-10-26 MED ORDER — OXYCODONE-ACETAMINOPHEN 5-325 MG PO TABS: 1.0000 | ORAL_TABLET | ORAL | 0 refills | Status: AC | PRN

## 2018-10-26 MED ORDER — SCOPOLAMINE 1 MG/3DAYS TD PT72
1.0000 | MEDICATED_PATCH | TRANSDERMAL | Status: AC
  Administered 2018-10-26: 1 via TRANSDERMAL
  Administered 2018-10-26: 1.5 mg via TRANSDERMAL
  Filled 2018-10-26: qty 1

## 2018-10-26 MED ORDER — DEXAMETHASONE SODIUM PHOSPHATE 4 MG/ML IJ SOLN
4.0000 mg | INTRAMUSCULAR | Status: AC
  Administered 2018-10-26: 4 mg via INTRAVENOUS

## 2018-10-26 MED ORDER — BUPIVACAINE-EPINEPHRINE (PF) 0.25% -1:200000 IJ SOLN
INTRAMUSCULAR | Status: AC
  Filled 2018-10-26: qty 30

## 2018-10-26 MED ORDER — BUPIVACAINE-EPINEPHRINE (PF) 0.25% -1:200000 IJ SOLN
INTRAMUSCULAR | Status: DC | PRN
  Administered 2018-10-26: 20 mL

## 2018-10-26 MED ORDER — ACETAMINOPHEN 650 MG RE SUPP
650.0000 mg | RECTAL | Status: DC | PRN
  Filled 2018-10-26: qty 1

## 2018-10-26 MED ORDER — ROCURONIUM BROMIDE 10 MG/ML (PF) SYRINGE
PREFILLED_SYRINGE | INTRAVENOUS | Status: AC
  Filled 2018-10-26: qty 10

## 2018-10-26 MED ORDER — PROMETHAZINE HCL 25 MG/ML IJ SOLN: 6.2500 mg | INTRAMUSCULAR | Status: DC | PRN

## 2018-10-26 MED ORDER — LIDOCAINE HCL 2 % IJ SOLN
INTRAMUSCULAR | Status: AC
  Filled 2018-10-26: qty 20

## 2018-10-26 MED ORDER — ROCURONIUM BROMIDE 100 MG/10ML IV SOLN
INTRAVENOUS | Status: DC | PRN
  Administered 2018-10-26: 50 mg via INTRAVENOUS
  Administered 2018-10-26: 20 mg via INTRAVENOUS
  Administered 2018-10-26: 5 mg via INTRAVENOUS

## 2018-10-26 MED ORDER — LACTATED RINGERS IV SOLN
INTRAVENOUS | Status: DC
  Administered 2018-10-26 (×2): via INTRAVENOUS

## 2018-10-26 MED ORDER — GLYCOPYRROLATE 0.2 MG/ML IJ SOLN
INTRAMUSCULAR | Status: DC | PRN
  Administered 2018-10-26: 0.2 mg via INTRAVENOUS

## 2018-10-26 MED ORDER — HYDROMORPHONE HCL 1 MG/ML IJ SOLN
INTRAMUSCULAR | Status: AC
  Filled 2018-10-26: qty 1

## 2018-10-26 MED ORDER — CEFAZOLIN SODIUM-DEXTROSE 2-4 GM/100ML-% IV SOLN
2.0000 g | INTRAVENOUS | Status: AC
  Administered 2018-10-26: 2 g via INTRAVENOUS
  Filled 2018-10-26: qty 100

## 2018-10-26 MED ORDER — SODIUM CHLORIDE 0.9 % IV SOLN: 250.0000 mL | INTRAVENOUS | Status: DC | PRN

## 2018-10-26 MED ORDER — SODIUM CHLORIDE (PF) 0.9 % IJ SOLN
INTRAMUSCULAR | Status: DC | PRN
  Administered 2018-10-26: 10 mL

## 2018-10-26 MED ORDER — DEXAMETHASONE SODIUM PHOSPHATE 10 MG/ML IJ SOLN
INTRAMUSCULAR | Status: AC
  Filled 2018-10-26: qty 1

## 2018-10-26 MED ORDER — OXYCODONE HCL 5 MG PO TABS
ORAL_TABLET | ORAL | Status: AC
  Filled 2018-10-26: qty 1

## 2018-10-26 MED ORDER — CELECOXIB 200 MG PO CAPS
400.0000 mg | ORAL_CAPSULE | ORAL | Status: AC
  Administered 2018-10-26: 400 mg via ORAL
  Filled 2018-10-26: qty 2

## 2018-10-26 MED ORDER — SUGAMMADEX SODIUM 200 MG/2ML IV SOLN
INTRAVENOUS | Status: AC
  Filled 2018-10-26: qty 2

## 2018-10-26 MED ORDER — OXYCODONE HCL 5 MG/5ML PO SOLN
5.0000 mg | Freq: Once | ORAL | Status: DC | PRN
  Filled 2018-10-26: qty 5

## 2018-10-26 MED ORDER — SODIUM CHLORIDE (PF) 0.9 % IJ SOLN
INTRAMUSCULAR | Status: AC
  Filled 2018-10-26: qty 10

## 2018-10-26 MED ORDER — SODIUM CHLORIDE 0.9% FLUSH: 3.0000 mL | Freq: Two times a day (BID) | INTRAVENOUS | Status: DC

## 2018-10-26 MED ORDER — SODIUM CHLORIDE 0.9% FLUSH: 3.0000 mL | INTRAVENOUS | Status: DC | PRN

## 2018-10-26 MED ORDER — ONDANSETRON HCL 4 MG/2ML IJ SOLN
INTRAMUSCULAR | Status: DC | PRN
  Administered 2018-10-26: 4 mg via INTRAVENOUS

## 2018-10-26 MED ORDER — ONDANSETRON HCL 4 MG/2ML IJ SOLN
INTRAMUSCULAR | Status: AC
  Filled 2018-10-26: qty 2

## 2018-10-26 SURGICAL SUPPLY — 55 items
APPLICATOR SURGIFLO ENDO (HEMOSTASIS) IMPLANT
BAG LAPAROSCOPIC 12 15 PORT 16 (BASKET) ×1 IMPLANT
BAG RETRIEVAL 12/15 (BASKET) ×2
COVER BACK TABLE 60X90IN (DRAPES) ×2 IMPLANT
COVER TIP SHEARS 8 DVNC (MISCELLANEOUS) ×1 IMPLANT
COVER TIP SHEARS 8MM DA VINCI (MISCELLANEOUS) ×1
COVER WAND RF STERILE (DRAPES) ×2 IMPLANT
DERMABOND ADVANCED (GAUZE/BANDAGES/DRESSINGS) ×1
DERMABOND ADVANCED .7 DNX12 (GAUZE/BANDAGES/DRESSINGS) ×1 IMPLANT
DRAPE ARM DVNC X/XI (DISPOSABLE) ×4 IMPLANT
DRAPE COLUMN DVNC XI (DISPOSABLE) ×1 IMPLANT
DRAPE DA VINCI XI ARM (DISPOSABLE) ×4
DRAPE DA VINCI XI COLUMN (DISPOSABLE) ×1
DRAPE SHEET LG 3/4 BI-LAMINATE (DRAPES) ×2 IMPLANT
DRAPE SURG IRRIG POUCH 19X23 (DRAPES) ×2 IMPLANT
DRSG OPSITE POSTOP 4X6 (GAUZE/BANDAGES/DRESSINGS) ×2 IMPLANT
ELECT PENCIL ROCKER SW 15FT (MISCELLANEOUS) ×2 IMPLANT
ELECT REM PT RETURN 15FT ADLT (MISCELLANEOUS) ×2 IMPLANT
GLOVE BIO SURGEON STRL SZ 6 (GLOVE) ×8 IMPLANT
GLOVE BIO SURGEON STRL SZ 6.5 (GLOVE) ×4 IMPLANT
GOWN STRL REUS W/ TWL LRG LVL3 (GOWN DISPOSABLE) ×2 IMPLANT
GOWN STRL REUS W/TWL LRG LVL3 (GOWN DISPOSABLE) ×2
HOLDER FOLEY CATH W/STRAP (MISCELLANEOUS) IMPLANT
IRRIG SUCT STRYKERFLOW 2 WTIP (MISCELLANEOUS) ×2
IRRIGATION SUCT STRKRFLW 2 WTP (MISCELLANEOUS) ×1 IMPLANT
KIT PROCEDURE DA VINCI SI (MISCELLANEOUS)
KIT PROCEDURE DVNC SI (MISCELLANEOUS) IMPLANT
MANIPULATOR ADVINCU DEL 3.5 PL (MISCELLANEOUS) ×2 IMPLANT
MANIPULATOR UTERINE 4.5 ZUMI (MISCELLANEOUS) IMPLANT
NEEDLE HYPO 22GX1.5 SAFETY (NEEDLE) ×2 IMPLANT
NEEDLE SPNL 18GX3.5 QUINCKE PK (NEEDLE) IMPLANT
OBTURATOR OPTICAL STANDARD 8MM (TROCAR) ×1
OBTURATOR OPTICAL STND 8 DVNC (TROCAR) ×1
OBTURATOR OPTICALSTD 8 DVNC (TROCAR) ×1 IMPLANT
PACK ROBOT GYN CUSTOM WL (TRAY / TRAY PROCEDURE) ×2 IMPLANT
PAD POSITIONING PINK XL (MISCELLANEOUS) ×2 IMPLANT
PORT ACCESS TROCAR AIRSEAL 12 (TROCAR) ×1 IMPLANT
PORT ACCESS TROCAR AIRSEAL 5M (TROCAR) ×1
POUCH SPECIMEN RETRIEVAL 10MM (ENDOMECHANICALS) IMPLANT
SEAL CANN UNIV 5-8 DVNC XI (MISCELLANEOUS) ×3 IMPLANT
SEAL XI 5MM-8MM UNIVERSAL (MISCELLANEOUS) ×3
SET TRI-LUMEN FLTR TB AIRSEAL (TUBING) ×2 IMPLANT
SPONGE LAP 18X18 RF (DISPOSABLE) ×2 IMPLANT
SURGIFLO W/THROMBIN 8M KIT (HEMOSTASIS) IMPLANT
SUT MNCRL AB 4-0 PS2 18 (SUTURE) ×2 IMPLANT
SUT PDS AB 1 TP1 96 (SUTURE) ×2 IMPLANT
SUT VIC AB 0 CT1 27 (SUTURE) ×2
SUT VIC AB 0 CT1 27XBRD ANTBC (SUTURE) ×2 IMPLANT
SYR 10ML LL (SYRINGE) IMPLANT
TOWEL OR NON WOVEN STRL DISP B (DISPOSABLE) ×2 IMPLANT
TRAP SPECIMEN MUCOUS 40CC (MISCELLANEOUS) ×2 IMPLANT
TRAY FOLEY MTR SLVR 16FR STAT (SET/KITS/TRAYS/PACK) ×2 IMPLANT
UNDERPAD 30X30 (UNDERPADS AND DIAPERS) ×2 IMPLANT
WATER STERILE IRR 1000ML POUR (IV SOLUTION) ×2 IMPLANT
YANKAUER SUCT BULB TIP 10FT TU (MISCELLANEOUS) ×2 IMPLANT

## 2018-10-26 NOTE — Progress Notes (Signed)
Pt is more comfortable, has voided, tolerated po fluids, no nausea; ready to go home; discharged to care of spouse

## 2018-10-26 NOTE — Anesthesia Postprocedure Evaluation (Signed)
Anesthesia Post Note  Patient: Brandy Henson  Procedure(s) Performed: XI ROBOTIC ASSISTED TOTAL HYSTERECTOMY FOR UTERUS GREATER THAN 250 GRAMS WITH BILATERAL SALPINGO OOPHORECTOMY AND MINI LAPARATOMY (Bilateral )     Patient location during evaluation: PACU Anesthesia Type: General Level of consciousness: awake and alert Pain management: pain level controlled Vital Signs Assessment: post-procedure vital signs reviewed and stable Respiratory status: spontaneous breathing, nonlabored ventilation and respiratory function stable Cardiovascular status: blood pressure returned to baseline and stable Postop Assessment: no apparent nausea or vomiting Anesthetic complications: no    Last Vitals:  Vitals:   10/26/18 1045 10/26/18 1113  BP: 126/67 109/75  Pulse: (!) 52 64  Resp: (!) 9 12  Temp:  37 C  SpO2: 93% 99%    Last Pain:  Vitals:   10/26/18 1113  TempSrc: Oral  PainSc: Brandy Henson

## 2018-10-26 NOTE — Anesthesia Preprocedure Evaluation (Addendum)
Anesthesia Evaluation  Patient identified by MRN, date of birth, ID band Patient awake    Reviewed: Allergy & Precautions, NPO status , Patient's Chart, lab work & pertinent test results  Airway Mallampati: II  TM Distance: >3 FB Neck ROM: Full    Dental no notable dental hx.    Pulmonary neg pulmonary ROS, former smoker,    Pulmonary exam normal breath sounds clear to auscultation       Cardiovascular negative cardio ROS Normal cardiovascular exam Rhythm:Regular Rate:Normal     Neuro/Psych negative neurological ROS  negative psych ROS   GI/Hepatic negative GI ROS, Neg liver ROS,   Endo/Other  negative endocrine ROS  Renal/GU negative Renal ROS  negative genitourinary   Musculoskeletal  (+) Arthritis , Osteoarthritis,    Abdominal   Peds negative pediatric ROS (+)  Hematology negative hematology ROS (+)   Anesthesia Other Findings Lynch Syndrome  Reproductive/Obstetrics negative OB ROS                             Anesthesia Physical Anesthesia Plan  ASA: II  Anesthesia Plan: General   Post-op Pain Management:    Induction: Intravenous  PONV Risk Score and Plan: 3 and Ondansetron, Dexamethasone and Midazolam  Airway Management Planned: Oral ETT  Additional Equipment:   Intra-op Plan:   Post-operative Plan: Extubation in OR  Informed Consent: I have reviewed the patients History and Physical, chart, labs and discussed the procedure including the risks, benefits and alternatives for the proposed anesthesia with the patient or authorized representative who has indicated his/her understanding and acceptance.   Dental advisory given  Plan Discussed with: CRNA  Anesthesia Plan Comments:         Anesthesia Quick Evaluation

## 2018-10-26 NOTE — Transfer of Care (Signed)
Immediate Anesthesia Transfer of Care Note  Patient: Brandy Henson  Procedure(s) Performed: XI ROBOTIC ASSISTED TOTAL HYSTERECTOMY FOR UTERUS GREATER THAN 250 GRAMS WITH BILATERAL SALPINGO OOPHORECTOMY AND MINI LAPARATOMY (Bilateral )  Patient Location: PACU  Anesthesia Type:General  Level of Consciousness: awake, alert  and oriented  Airway & Oxygen Therapy: Patient Spontanous Breathing and Patient connected to face mask oxygen  Post-op Assessment: Report given to RN and Post -op Vital signs reviewed and stable  Post vital signs: Reviewed and stable  Last Vitals:  Vitals Value Taken Time  BP 130/80 10/26/2018  9:50 AM  Temp    Pulse 77 10/26/2018  9:52 AM  Resp 17 10/26/2018  9:52 AM  SpO2 100 % 10/26/2018  9:52 AM  Vitals shown include unvalidated device data.  Last Pain:  Vitals:   10/26/18 0611  TempSrc:   PainSc: 0-No pain         Complications: No apparent anesthesia complications

## 2018-10-26 NOTE — Discharge Instructions (Signed)
Planning for °Recovery and °Going Home °Your Guide to Gynecologic Surgery   ° ° °In-Hospital Recovery Plan °Team Caring for You After Surgery °In addition to the nursing staff on the unit, the gynecological surgery team will °care for you. This team is led by your surgeon and includes medical students and a °physician assistant or nurse practitioner. There will be a physician in the °hospital 24 hours a day to tend to your needs. The students °report directly to your surgeon, who is the one overseeing all of your care. ° °Pain Relief After Surgery °Your pain will be assessed regularly on a scale from 0 to 10. Pain assessment is  °necessary to guide your pain relief. It is essential that you are able to take deep °breaths, cough and move. Prevention or early treatment of pain is far more °effective than trying to treat severe pain. Therefore, we have devised a specialized °regimen to stay ahead of your pain and use almost no narcotics, which can slow °down your recovery process. If you have an epidural catheter, you will receive a  °constant infusion of pain medication through your epidural. If you need °additional pain relief, you will be able to push a button to increase the medication in °your epidural. You will also be given acetaminophen and an ibuprofen-like °medication to keep your pain under control.  You can always ask for additional pain pills if you are not comfortable. In most cases an anesthesiologist with expertise in pain management will visit you every day and help design your pain management plan. ° °One Day After Surgery °Focus on drinking and walking. You will start drinking clear liquids after °surgery. The intravenous fluids will be stopped, and the catheter may be removed  °from your bladder. We expect you to get out of bed, with the nurses' or assistants' °help, sit in a chair for six hours and start to move about in the hallways. You will °also meet with a case manager to assess your discharge  needs, including home °nursing. Your physician may order home care to assist with your transition home.  °Home nursing visits, which are intermittent, help you get readjusted to home by °teaching treatments, monitoring medications, and performing clinical assessment °and reporting back to your physician. Other services may include therapy and °medical equipment; private duty services are also available. If you are going °"home" to a different address upon discharge, please alert us. A Home Care °Coordinator can visit with you while in the hospital to discuss your options. If you °have questions please speak with your case manager. If you °need rehabilitation at a facility, a social worker will assist with this. If you need °rehabilitation at a facility, a social worker will assist with this. °If your procedure was performed in a minimally invasive fashion, you will be discharged to home if your pain is well controlled and you are tolerating a regular diet.   ° ° °Two Days After Surgery °You will start eating a soft diet and change to a more solid diet as you feel up to it. The catheter from your bladder will be removed, if not already done so. If there is a dressing on your wound, it will be removed. The tubing will be disconnected from your IV. °We expect you to be out of bed for the majority of the day and walking at least three times in the hallway, with assistance as needed.  You may be discharged at this point if it is felt you are   ready.  ° °Three Days After Surgery °You continue to eat your low residue diet. You may be ready to go home if you are °drinking enough to keep yourself hydrated, your pain is well controlled, you are not °belching or nauseated, you are passing gas and you are able to get around on your °own. However, we will not discharge you from the hospital until we are sure you are °ready. ° °Discharge °Discharge time is at 10 a.m. You will need to make arrangements for someone to °accompany you  home. You will not be released without someone present. °Please keep in mind that we strive to get patients discharged as quickly as possible, °but there may be delays for a variety of reasons. °Complications That May Delay Discharge: °? Nausea and vomiting: It is very common to feel sick after your surgery. We give °you medication to reduce this. However, if you do feel sick, you should reduce the °amount you are taking by mouth. Small, frequent meals or drinks are best in this  °situation. As long as you can drink and keep yourself hydrated, the nausea will likely °pass.  °Ileus: Following surgery, the bowel can be sluggish, making it difficult for food °and gas to pass through the intestines. This is called an ileus. We have designed our °care program to do everything possible to reduce the likelihood of an ileus. If you do °develop an ileus, it usually only lasts two to three days. However, it may require a °small tube down the nose to decompress the stomach. The best way to avoid an °ileus is to reduce the amount of narcotic pain medications, get up as °much as possible after your surgery, and stimulate the bowel early after °surgery with small amounts of food and liquids. ° Wound infection: If a wound infection develops, this usually happens three °to ten days after surgery.  ° ° Urinary retention: This is if you are unable to urinate after the catheter °from your bladder is removed. The catheter may need to be reinserted until °you are able to urinate on your own. This can be caused by anesthesia, pain °medication and decreased activity.  ° ° °When you are preparing to go home, you will receive: ° Detailed discharge instructions, with information about your operation °and medications  ° ° All prescriptions for medications you need at home; prescriptions can be °filled while you are in the hospital if you would like  ° ° You may be prescribed Lovenox. Lovenox is used to reduce the risk of °developing a blood  clot after surgery. °An appointment to see your surgeon or provider one to two weeks after you °leave the hospital for follow-up  ° °After Discharge °Once you are discharged: °Call us at any time if you are worried about your recovery or if °you should have any questions. °During regular office hours, (8:30 a.m.-4:30 p.m.), and after hours call [phone]. ° °Call us immediately if: ° You have a fever higher than 100.4 degrees.  ° °Your wound is red, more painful or has drainage.  ° ° You are nauseated, vomiting or can't keep liquids down.  ° ° Your pain is worse and not able to be controlled with the regimen you were °sent home with.  ° ° If you are bleeding heavily or have a lot of fluid coming from your vagina. °If you are on narcotics, the goal is to wean you off of them. If you are running low °on supply and need more,   call the nurse a few days before you will run out.  °It is generally easier to reach someone between 8:30 a.m. - 4:30 p.m., so call °early if you think something is not right. A nurse or nurse practitioner is °available every day to answer your questions. After hours and on the °weekends, the calls go to the resident doctors in the hospital. It may take °longer for your phone call to be returned during this time. °If you have a true emergency, such as severe abdominal pain, chest pain, °shortness of breath or any other acute issues, call 911 and go to the local °emergency room. Have them contact our team once you are stable. ° °Concerns After Discharge °Bowel Function Following Your Surgery °Your bowels will take several weeks to settle down and may be unpredictable °at first. Your bowel movements may become loose, or you may be constipated. °For the vast number of patients, this will get back to normal with time. Make °sure you eat nutritious meals, drink plenty of fluids and take regular walks °during the first two weeks after your operation. °Your Guide to Gynecologic Surgery   ° °Abdominal  Pain °It is not unusual to suffer gripping pains (colic) during the first week following °removal of a portion of your bowel. This pain usually lasts for a few minutes °but goes away between spasms. If you have severe pain lasting more than one °to two hours or have a fever and feel generally unwell, you should contact us at  °the telephone contact numbers listed at the end of this packet. °Hysterectomy: °You should have pelvic rest for six (6) weeks or as specified by your doctor °after surgery. You should have nothing in the vagina (no tampons, douching, °intercourse, etc.,) during this time period. °If you have some vaginal spotting, this is normal. If you have heavy bleeding or  °a lot of fluid from your vagina, this is NOT normal and you should contact your °doctor's office or, if after hours, contact the doctor on call. ° °Diarrhea: Fiber and Imodium (Loperamide) °The first step to improving your frequent or loose stools is to bulk up the stool °with fiber. Metamucil is the most common type of fiber that is available at any °drug store. Start with 1 teaspoon mixed into food, like yogurt or oatmeal, in °the morning and evening. Try not to drink any fluid for one hour after you take °the fiber. This will allow the fiber to act like a sponge in your intestines, °soaking up all the excess water. Continue this for three to five days. °You may increase by 1 teaspoon every three to five days until the desired affect, °or you are at 1 tablespoon (3 teaspoons) twice a day. If this doesn't work, you °may try over-the-counter Loperamide, which is an antidiarrheal medication. You °may take one tablet in the morning and evening or 30 minutes before you °typically have diarrhea. You may take up to eight of these tablets daily. It is best °to discuss this with us prior to using this medication. If you have continuous °diarrhea and abdominal cramping, call [phone]. ° °Foley Catheter °Your surgeon may recommend you be  discharged home with a foley catheter °(bladder catheter) for 1 to 2 weeks. Typically this recommendation will be °made for patients undergoing surgery to the lower urinary tract. Before you leave the hospital, your nurse should outfit you with a clip on the inner thigh to secure the catheter to prevent pulling as well as a   small bag that can be easily worn on the upper leg under loose fitting pants and skirts. Your nurse will teach you how to exchange the large bag that typically comes with the catheter for the small bag. You may find it °convenient to attach the small bag when active during the day and then the °large bag when sleeping at night. If there is ever a point when you notice the catheter is not draining urine and youbegin to develop pain behind/above the pubic bone, you should report to the clinic or emergency room immediately as the catheter may be kinked or clogged. Kinking or clogging of the catheter prevents urine from draining °from your bladder. Urine will quickly build up in the bladder and can cause °severe pain as well as seriously disrupt healing if you have undergone surgery °on the lower urinary tract. Additionally, pulling on the catheter can result in °displacement of the balloon at the end of the catheter from inside of to outside  °of the bladder. This also results in severe pain and can cause bleeding. For °this reason, secure the catheter to the clip on your inner thigh at all times as the °clip prevents against pulling. ° °Wound Care °For the first few weeks following surgery, your wound may be slightly red and °uncomfortable. You may shower and let the soapy water wash over your incision. °Avoid soaking in the tub for one month following surgery or until °the wound is well healed. It will take the wound several months to °"soften." It is common to have bumpy areas in the wound near the belly  °button and at the ends of the incision. ° °If you have staples, these should be removed  when you are seen by your °surgeon at the follow-up appointment. You may have a glue-like material on °your incision. Do not pick at this. It will come off over time. It is the °surgical glue used in surgery to close your incision. You also have sutures °inside of you that will dissolve over time ° °Post-Surgery Diet °Attention to good nutrition after surgery is important to your recovery. If °you had no dietary restrictions prior to the surgery, you will have no °special dietary restrictions after the surgery. However, consuming enough °protein, calories, vitamins and minerals is necessary to support healing. °Some patients find their appetite is less than normal after surgery. In this °case, frequent small meals throughout the day may help. °It is not uncommon to lose 10 to 15 pounds after surgery. However, by the °fourth to fifth week, your weight loss should stabilize. °It is normal that certain foods taste different and certain smells may make °you nauseas. °Over time, the amount you can comfortably consume will gradually increase. °You should try to eat a balanced diet, which includes: ° Foods that are soft, moist, and easy to chew and swallow  ° ° Canned or soft-cooked fruits and vegetables  ° °Plenty of soft breads, rice, pasta, potatoes and other starchy foods (lowerfiber  °varieties may be tolerated better initially)  ° ° High-protein foods and beverages, such as meats, eggs, milk, cottage cheese  °or a supplemental nutrition drink like Boost or Ensure  ° ° Drink plenty of fluids-at least 8 to 10 cups per day. This includes water,  °fruit juice, Gatorade, teas/coffee and milk. Drinking plenty is especially °important if you have loose stools (diarrhea). ° ° Avoid drinking a lot of caffeine, since this may dehydrate you.  ° ° Avoid fried, greasy and highly seasoned   or spicy foods.  ° ° Avoid carbonated beverages in the first couple weeks.  ° ° Avoid raw fruits and vegetables.  ° °Hobbies/Activities °Walking  is encouraged after your surgery. You should plan to undertake °regular exercise several times a day and gradually increase this during the °four weeks following your operation until you are back to your normal level °of activity. You may climb stairs. Don't do any heavy lifting greater than 10 °pounds or contact sports for the first month after your surgery. °Generally, you can return to hobbies and activities soon after your surgery. °This will help you recover. °It can take up to two to three months to fully recover. It is not unusual to be °fatigued and require an afternoon nap for up to six to eight weeks following °surgery. Your body is using this energy to heal your wounds. Set small goals °for yourself and try to do a little more each day. ° °Work °It is normal to return to work three to six weeks following your operation. If °your job involves heavy manual work, then you should wait six weeks. °However, you should check with your employer regarding rules, which may °be relevant to your return to work. If you need a return-to-work form for your °employer or disability papers, bring them to your follow-up appointment or °fax them to our office at [phone]. ° °Driving °You may drive when you are off narcotics and pain-free enough to react °quickly with your braking foot. For most patients, this occurs at one to four °weeks following surgery. ° ° °Write down any questions you may have to ask your care team. ° °Important Contact Numbers: °GYN Oncology Office: [phone] °

## 2018-10-26 NOTE — Anesthesia Procedure Notes (Signed)
Procedure Name: Intubation Date/Time: 10/26/2018 7:39 AM Performed by: Glory Buff, CRNA Pre-anesthesia Checklist: Patient identified, Emergency Drugs available, Suction available and Patient being monitored Patient Re-evaluated:Patient Re-evaluated prior to induction Oxygen Delivery Method: Circle system utilized Preoxygenation: Pre-oxygenation with 100% oxygen Induction Type: IV induction Ventilation: Mask ventilation without difficulty Laryngoscope Size: Miller and 3 Grade View: Grade II Tube type: Oral Tube size: 7.0 mm Number of attempts: 1 Airway Equipment and Method: Stylet and Oral airway Placement Confirmation: ETT inserted through vocal cords under direct vision,  positive ETCO2 and breath sounds checked- equal and bilateral Secured at: 21 cm Tube secured with: Tape Dental Injury: Teeth and Oropharynx as per pre-operative assessment

## 2018-10-26 NOTE — H&P (Signed)
Consult Note: Gyn-Onc  Consult was requested by Brandy Henson for the evaluation of Brandy Henson 62 y.o. female  CC:  Lynch Syndrome  Assessment/Plan:  Brandy Henson  is a 62 y.o.  year old with Lynch syndrome (PSM2 mutation positive).  A lengthy discussion with Brandy Henson regarding the etiology of Lynch syndrome and its role in causing increased risk for cancer.  I discussed the increased risk for ovarian and endometrial cancer that are associated with this mutation.  I discussed that postmenopausal women I recommended to undergo risk reducing total hysterectomy with BSO.  Brandy Henson is a good surgical candidate and I think would be a good candidate for robotic assisted approach.  HPI: Brandy Henson is a 62 year old P4 who was seen in consultation at the request of Brandy Henson for a newly diagnosed Lynch syndrome with a PSM2 Brandy Henson.  The patient's does not have a personal history of cancer however due to her family history which includes a maternal grandmother with breast cancer diagnosed in her 4s and a paternal grandmother with cancer in her 39s this triggered her gynecologist Brandy Henson to order genetic screening.  This screening was performed and identified a mutation in PSM 2.  A transvaginal ultrasound in June 2019 revealed an enlarged fibroid uterus measuring 12.1 x 6.7 x 9 cm.  The endometrium was 7 mm thick.  The ovaries were not visualized due to the fibroids but no adnexal masses was seen.  She is otherwise healthy with no prior abdominal surgeries other than pelvic laparoscopy.   She had an endometrial ablation in 2006 with benign sampling (per patient).   The patient has a history of normal paps (last >2years ago).   Current Meds:  Outpatient Encounter Medications as of 07/28/2018  Medication Sig  . estradiol (MINIVELLE) 0.075 MG/24HR APPLY 1 PATCH ONTO THE SKIN TWICE WEEKLY  . Multiple Vitamin (MULTIVITAMIN) tablet Take 1 tablet by mouth daily.  . progesterone (PROMETRIUM) 100 MG  capsule Take 1 capsule (100 mg total) by mouth at bedtime.  . RESTASIS 0.05 % ophthalmic emulsion Take 1 tablet by mouth as needed.   . Ascorbic Acid (VITAMIN C PO) Take 1 tablet by mouth daily.   . Cholecalciferol (VITAMIN D PO) Take 1 tablet by mouth daily.   Marland Kitchen MINIVELLE 0.075 MG/24HR APPLY 1 PATCH ON THE SKIN TWICE WEEKLY (Patient not taking: Reported on 06/06/2018)  . Omega-3 Fatty Acids (OMEGA 3 PO) Take 1 tablet by mouth daily.   . [DISCONTINUED] neomycin-colistin-hydrocortisone-thonzonium (CORTISPORIN TC) 3.01-15-09-0.5 MG/ML otic suspension 4 (four) times daily.   No facility-administered encounter medications on file as of 07/28/2018.     Allergy:  Allergies  Allergen Reactions  . Erythromycin Other (See Comments)    Stomach upset    Social Hx:   Social History   Socioeconomic History  . Marital status: Married    Spouse name: Not on file  . Number of children: Not on file  . Years of education: Not on file  . Highest education level: Not on file  Occupational History  . Not on file  Social Needs  . Financial resource strain: Not on file  . Food insecurity:    Worry: Not on file    Inability: Not on file  . Transportation needs:    Medical: Not on file    Non-medical: Not on file  Tobacco Use  . Smoking status: Former Smoker    Years: 2.00  . Smokeless tobacco: Never Used  Substance and Sexual Activity  .  Alcohol use: Yes    Comment: Social  . Drug use: No  . Sexual activity: Yes    Birth control/protection: Post-menopausal  Lifestyle  . Physical activity:    Days per week: Not on file    Minutes per session: Not on file  . Stress: Not on file  Relationships  . Social connections:    Talks on phone: Not on file    Gets together: Not on file    Attends religious service: Not on file    Active member of club or organization: Not on file    Attends meetings of clubs or organizations: Not on file    Relationship status: Not on file  . Intimate partner  violence:    Fear of current or ex partner: Not on file    Emotionally abused: Not on file    Physically abused: Not on file    Forced sexual activity: Not on file  Other Topics Concern  . Not on file  Social History Narrative  . Not on file    Past Surgical Hx:  Past Surgical History:  Procedure Laterality Date  . ABDOMINAL HYSTERECTOMY     Brandy Henson  10-26-18  Lynch Syndrome  . AUGMENTATION MAMMAPLASTY    . ELBOW SURGERY    . ENDOMETRIAL ABLATION  2006  . KNEE SURGERY Right 2010  . KNEE SURGERY Bilateral   . PELVIC LAPAROSCOPY      Past Medical Hx:  Past Medical History:  Diagnosis Date  . Arthritis    pt. denies  . Pneumonia     Past Gynecological History:  Endometrial ablation. No LMP recorded. Patient has had an ablation.  Family Hx:  Family History  Problem Relation Age of Onset  . Hypertension Father   . Diabetes Father   . Dementia Father   . Breast cancer Maternal Grandmother 21  . Diabetes Mother   . Hypertension Mother   . Cancer Paternal Grandmother        ? Uterine or ovarian    Review of Systems:  Constitutional  Feels well,    ENT Normal appearing ears and nares bilaterally Skin/Breast  No rash, sores, jaundice, itching, dryness Cardiovascular  No chest pain, shortness of breath, or edema  Pulmonary  No cough or wheeze.  Gastro Intestinal  No nausea, vomitting, or diarrhoea. No bright red blood per rectum, no abdominal pain, change in bowel movement, or constipation.  Genito Urinary  No frequency, urgency, dysuria, no bleeding Musculo Skeletal  No myalgia, arthralgia, joint swelling or pain  Neurologic  No weakness, numbness, change in gait,  Psychology  No depression, anxiety, insomnia.   Vitals:  Blood pressure 132/74, pulse (!) 57, temperature 97.6 F (36.4 C), temperature source Oral, resp. rate 16, SpO2 100 %.  Physical Exam: WD in NAD Neck  Supple NROM, without any enlargements.  Lymph Node Survey No cervical  supraclavicular or inguinal adenopathy Cardiovascular  Pulse normal rate, regularity and rhythm. S1 and S2 normal.  Lungs  Clear to auscultation bilateraly, without wheezes/crackles/rhonchi. Good air movement.  Skin  No rash/lesions/breakdown  Psychiatry  Alert and oriented to person, place, and time  Abdomen  Normoactive bowel sounds, abdomen soft, non-tender and thin without evidence of hernia.  Back No CVA tenderness Genito Urinary  Vulva/vagina: Normal external female genitalia.   No lesions. No discharge or bleeding.  Bladder/urethra:  No lesions or masses, well supported bladder  Vagina: normal  Cervix: Normal appearing, no lesions. PAP TAKEN   Uterus:  slightly globular,  mobile, no parametrial involvement or nodularity.  Adnexa: no palpabe masses. Rectal  deferred Extremities  No bilateral cyanosis, clubbing or edema.   Thereasa Solo, MD  10/26/2018, 7:18 AM

## 2018-10-26 NOTE — Op Note (Signed)
OPERATIVE NOTE 10/26/18  Surgeon: Donaciano Eva   Assistants: Dr Lahoma Crocker (an MD assistant was necessary for tissue manipulation, management of robotic instrumentation, retraction and positioning due to the complexity of the case and hospital policies).   Anesthesia: General endotracheal anesthesia  ASA Class: 3   Pre-operative Diagnosis: Lynch syndrome, fibroid uterus.   Post-operative Diagnosis: same  Operation: Robotic-assisted laparoscopic total hysterectomy >250gm,with bilateral salpingoophorectomy, minilaparotomy  Surgeon: Donaciano Eva  Assistant Surgeon: Lahoma Crocker MD  Anesthesia: GET  Urine Output: 300cc  Operative Findings:  : 14cm fibroid uterus with left broad ligament fibroid. Normal ovaries bilaterally, normal upper abdomen.  Estimated Blood Loss:  25cc      Total IV Fluids: 800 ml         Specimens: uterus with cervix, bilateral tubes and ovaries         Complications:  None; patient tolerated the procedure well.         Disposition: PACU - hemodynamically stable.  Procedure Details  The patient was seen in the Holding Room. The risks, benefits, complications, treatment options, and expected outcomes were discussed with the patient.  The patient concurred with the proposed plan, giving informed consent.  The site of surgery properly noted/marked. The patient was identified as Brandy Henson and the procedure verified as a Robotic-assisted hysterectomy with bilateral salpingo oophorectomy. A Time Out was held and the above information confirmed.  After induction of anesthesia, the patient was draped and prepped in the usual sterile manner. Pt was placed in supine position after anesthesia and draped and prepped in the usual sterile manner. The abdominal drape was placed after the CholoraPrep had been allowed to dry for 3 minutes.  Her arms were tucked to her side with all appropriate precautions.  The shoulders were stabilized with  padded shoulder blocks applied to the acromium processes.  The patient was placed in the semi-lithotomy position in [place].  The perineum was prepped with Betadine. The patient was then prepped. Foley catheter was placed.  A sterile speculum was placed in the vagina.  The cervix was grasped with a single-tooth tenaculum and dilated with Kennon Rounds dilators.  The ZUMI uterine manipulator with a medium colpotomizer ring was placed without difficulty.  A pneum occluder balloon was placed over the manipulator.  OG tube placement was confirmed and to suction.   Next, a 5 mm skin incision was made 1 cm below the subcostal margin in the midclavicular line.  The 5 mm Optiview port and scope was used for direct entry.  Opening pressure was under 10 mm CO2.  The abdomen was insufflated and the findings were noted as above.   At this point and all points during the procedure, the patient's intra-abdominal pressure did not exceed 15 mmHg. Next, a 10 mm skin incision was made in the umbilicus and a right and left port was placed about 10 cm lateral to the robot port on the right and left side. All ports were placed under direct visualization.  The patient was placed in steep Trendelenburg.  Bowel was folded away into the upper abdomen.  The robot was docked in the normal manner.  The hysterectomy was started after the round ligament on the right side was incised and the retroperitoneum was entered and the pararectal space was developed.  The ureter was noted to be on the medial leaf of the broad ligament.  The peritoneum above the ureter was incised and stretched and the infundibulopelvic ligament was skeletonized,  cauterized and cut.  The posterior peritoneum was taken down to the level of the KOH ring.  The anterior peritoneum was also taken down.  The bladder flap was created to the level of the KOH ring.  The uterine artery on the right side was skeletonized, cauterized and cut in the normal manner.    Dissection on  the left was complex due to the broad ligament fibroid. Meticulous dissection took place to skeletonize the left ureter from the broad ligament and mobilize it. The left uterine artery was identified at the origin from interal iliac artery, 360 degree skeletonized and sealed at its origin. The bladder flap was completed on the left and this facilitated skeletonization of the uterine artery below the fibroid at the level of the isthmus. The vessels were skeletonized at the koh cup and sealed and transected. The colpotomy was made however due to the size of the fibroid, the uterus, cervix, bilateral ovaries and tubes could not be delivered through the vagina. The endocatch bag was used to encapsulate the uterine specimen while in the abdomen. Pedicles were inspected and excellent hemostasis was achieved.    The colpotomy at the vaginal cuff was closed with Vicryl on a CT1 needle in a running manner.  Irrigation was used and excellent hemostasis was achieved.  At this point in the robotic procedure was completed.  Robotic instruments were removed under direct visulaization.  The robot was undocked.   A 6cm suprapubic low transverse incision was made with the scalpel. The subcutaneous skin was opened with the bovie. The fascia was opened with the bovie transversely and the rectus muscles were dissected off of the fascia inferiorally and superiorally. The peritoneum was opened sharply in the midline. The peritoneal incision was extended. The uterine specimen in the endocatch bag was retrieved through the incision. The fascia was closed with running 0-vicryl. The subcutaneous fat was closed with 2-0 vicryl. 20cc of exparel mixed with 20cc of marcaine was infiltrated into the incision. The incision was closed at the skin with monocryl and dermabond. The 10 mm ports were closed with Vicryl on a UR-5 needle and the fascia was closed with 0 Vicryl on a UR-5 needle.  The skin was closed with 4-0 Vicryl in a subcuticular  manner.  Dermabond was applied.  Sponge, lap and needle counts correct x 2.  The patient was taken to the recovery room in stable condition.  The vagina was swabbed with  minimal bleeding noted.   All instrument and needle counts were correct x  3.   The patient was transferred to the recovery room in a stable condition.  Donaciano Eva, MD

## 2018-10-27 ENCOUNTER — Telehealth: Payer: Self-pay

## 2018-10-27 ENCOUNTER — Encounter (HOSPITAL_COMMUNITY): Payer: Self-pay | Admitting: Gynecologic Oncology

## 2018-10-27 NOTE — Telephone Encounter (Signed)
Outgoing call to patient per Joylene John NP, "same day discharge, how is she? Voiding? Pain? Appetite?"  Pt reports that voiding, pain level, and appetite are all fine.  Reports she has been up around house, walking well, did light load of laundry, rode with her husband to store but didn't go in.  Reminded her of activity level, reports using pain med sparingly, reminded can alternate Tylenol and Ibuprofen, reports hasn't had bm yet- but not concerned yet- said she will go back to her smoothie and/or coffee and try that first to see if she can have bm, reminded her narcotics can slow bowels, can use Senokot or stool softner daily, encouraged to call for fever or chills, went over care of incision ie can shower, soap water area gently, don't rub, pat dry. Encouraged pt to call for any questions or concerns.  Pt voiced understanding. No other needs per pt at this time.

## 2018-11-03 ENCOUNTER — Telehealth: Payer: Self-pay

## 2018-11-03 NOTE — Telephone Encounter (Signed)
Told Ms Sporer that the Honeycomb Dsg on her lower abdomen can be removed.  She can use soap and water lightly over the incision and the dermabond below will gradually peel off. Pt verbalized understanding.

## 2018-11-06 ENCOUNTER — Telehealth: Payer: Self-pay

## 2018-11-06 NOTE — Telephone Encounter (Signed)
Outgoing call to patient regarding final surgical path report dated 10/26/2018 per Joylene John NP as" no cancer on final path."  Pt voiced understanding.  No other needs per pt at this time.

## 2018-11-22 ENCOUNTER — Encounter: Payer: Self-pay | Admitting: Gynecologic Oncology

## 2018-11-22 ENCOUNTER — Inpatient Hospital Stay: Payer: BLUE CROSS/BLUE SHIELD | Attending: Gynecologic Oncology | Admitting: Gynecologic Oncology

## 2018-11-22 VITALS — BP 128/88 | HR 58 | Temp 98.1°F | Resp 18 | Ht 67.0 in | Wt 183.0 lb

## 2018-11-22 DIAGNOSIS — Z90710 Acquired absence of both cervix and uterus: Secondary | ICD-10-CM | POA: Insufficient documentation

## 2018-11-22 DIAGNOSIS — Z90722 Acquired absence of ovaries, bilateral: Secondary | ICD-10-CM

## 2018-11-22 DIAGNOSIS — Z1509 Genetic susceptibility to other malignant neoplasm: Secondary | ICD-10-CM

## 2018-11-22 NOTE — Progress Notes (Signed)
Postop follow-up note: Gyn-Onc  Consult was initially requested by Dr. Burr Medico for the evaluation of Brandy Henson 63 y.o. female  CC:  Chief Complaint  Patient presents with  . Lynch syndrome    Assessment/Plan:  Ms. DANASHA MELMAN  is a 63 y.o.  year old with Lynch syndrome (PSM2 mutation positive). S/p robotic hysterectomy, BSO for a uterus >250gm, with minilaparotomy for specimen delivery on 10/26/18.  She is healing well with no issues.  She will follow-up with Dr Radene Knee for wellness care annually.   HPI: Brandy Henson is a 63 year old P4 who was seen in consultation at the request of Dr.Feng for a newly diagnosed Lynch syndrome with a PSM2 Tatian.  The patient's does not have a personal history of cancer however due to her family history which includes a maternal grandmother with breast cancer diagnosed in her 30s and a paternal grandmother with cancer in her 57s this triggered her gynecologist Dr. Renold Don to order genetic screening.  This screening was performed and identified a mutation in PSM 2.  A transvaginal ultrasound in June 2019 revealed an enlarged fibroid uterus measuring 12.1 x 6.7 x 9 cm.  The endometrium was 7 mm thick.  The ovaries were not visualized due to the fibroids but no adnexal masses was seen.  She is otherwise healthy with no prior abdominal surgeries other than pelvic laparoscopy.   She had an endometrial ablation in 2006 with benign sampling (per patient).   The patient has a history of normal paps (last >2years ago).   Interval Hx:  Preoperatively endometrial biopsy and Pap smear were taken and were benign. On 10/26/18 she underwent biotic assisted total hysterectomy for uterus greater than 250 g, BSO.    She required a mini laparotomy for specimen delivery.  Final pathology confirmed benign uterus cervix tubes and ovaries.  Postoperatively she is done well with no complaints.  Current Meds:  Outpatient Encounter Medications as of 11/22/2018   Medication Sig  . adapalene (DIFFERIN) 0.1 % cream Apply 1 application topically daily as needed (skin blemishes).  Marland Kitchen estradiol (MINIVELLE) 0.075 MG/24HR APPLY 1 PATCH ONTO THE SKIN TWICE WEEKLY (Patient taking differently: Place 1 patch onto the skin 2 (two) times a week. APPLY 1 PATCH ONTO THE SKIN TWICE WEEKLY)  . MINIVELLE 0.075 MG/24HR APPLY 1 PATCH ON THE SKIN TWICE WEEKLY  . RESTASIS 0.05 % ophthalmic emulsion Place 1 tablet into both eyes daily as needed (dry eyes).   . [DISCONTINUED] progesterone (PROMETRIUM) 100 MG capsule Take 1 capsule (100 mg total) by mouth at bedtime. (Patient not taking: Reported on 11/22/2018)   No facility-administered encounter medications on file as of 11/22/2018.     Allergy:  Allergies  Allergen Reactions  . Erythromycin Other (See Comments)    Stomach upset    Social Hx:   Social History   Socioeconomic History  . Marital status: Married    Spouse name: Not on file  . Number of children: Not on file  . Years of education: Not on file  . Highest education level: Not on file  Occupational History  . Not on file  Social Needs  . Financial resource strain: Not on file  . Food insecurity:    Worry: Not on file    Inability: Not on file  . Transportation needs:    Medical: Not on file    Non-medical: Not on file  Tobacco Use  . Smoking status: Former Smoker    Years: 2.00  .  Smokeless tobacco: Never Used  Substance and Sexual Activity  . Alcohol use: Yes    Comment: Social  . Drug use: No  . Sexual activity: Yes    Birth control/protection: Post-menopausal  Lifestyle  . Physical activity:    Days per week: Not on file    Minutes per session: Not on file  . Stress: Not on file  Relationships  . Social connections:    Talks on phone: Not on file    Gets together: Not on file    Attends religious service: Not on file    Active member of club or organization: Not on file    Attends meetings of clubs or organizations: Not on file     Relationship status: Not on file  . Intimate partner violence:    Fear of current or ex partner: Not on file    Emotionally abused: Not on file    Physically abused: Not on file    Forced sexual activity: Not on file  Other Topics Concern  . Not on file  Social History Narrative  . Not on file    Past Surgical Hx:  Past Surgical History:  Procedure Laterality Date  . ABDOMINAL HYSTERECTOMY     Dr. Denman George  10-26-18  Lynch Syndrome  . AUGMENTATION MAMMAPLASTY    . ELBOW SURGERY    . ENDOMETRIAL ABLATION  2006  . KNEE SURGERY Right 2010  . KNEE SURGERY Bilateral   . PELVIC LAPAROSCOPY    . ROBOTIC ASSISTED TOTAL HYSTERECTOMY WITH BILATERAL SALPINGO OOPHERECTOMY Bilateral 10/26/2018   Procedure: XI ROBOTIC ASSISTED TOTAL HYSTERECTOMY FOR UTERUS GREATER THAN 250 GRAMS WITH BILATERAL SALPINGO OOPHORECTOMY AND MINI LAPARATOMY;  Surgeon: Everitt Amber, MD;  Location: [facility];  Service: Gynecology;  Laterality: Bilateral;    Past Medical Hx:  Past Medical History:  Diagnosis Date  . Arthritis    pt. denies  . Pneumonia     Past Gynecological History:  Endometrial ablation. No LMP recorded. Patient has had an ablation.  Family Hx:  Family History  Problem Relation Age of Onset  . Hypertension Father   . Diabetes Father   . Dementia Father   . Breast cancer Maternal Grandmother 33  . Diabetes Mother   . Hypertension Mother   . Cancer Paternal Grandmother        ? Uterine or ovarian    Review of Systems:  Constitutional  Feels well,    ENT Normal appearing ears and nares bilaterally Skin/Breast  No rash, sores, jaundice, itching, dryness Cardiovascular  No chest pain, shortness of breath, or edema  Pulmonary  No cough or wheeze.  Gastro Intestinal  No nausea, vomitting, or diarrhoea. No bright red blood per rectum, no abdominal pain, change in bowel movement, or constipation.  Genito Urinary  No frequency, urgency, dysuria, no bleeding Musculo Skeletal  No myalgia,  arthralgia, joint swelling or pain  Neurologic  No weakness, numbness, change in gait,  Psychology  No depression, anxiety, insomnia.   Vitals:  Blood pressure 128/88, pulse (!) 58, temperature 98.1 F (36.7 C), temperature source Oral, resp. rate 18, height 5\' 7"  (1.702 m), weight 183 lb (83 kg), SpO2 100 %.  Physical Exam: WD in NAD Neck  Supple NROM, without any enlargements.  Lymph Node Survey No cervical supraclavicular or inguinal adenopathy Cardiovascular  Pulse normal rate, regularity and rhythm. S1 and S2 normal.  Lungs  Clear to auscultation bilateraly, without wheezes/crackles/rhonchi. Good air movement.  Skin  No rash/lesions/breakdown  Psychiatry  Alert and oriented to person, place, and time  Abdomen  Normoactive bowel sounds, abdomen soft, non-tender and thin without evidence of hernia. Well healed incisions including minilap. Back No CVA tenderness Genito Urinary  Vaginal cuff healing normally, in tact.  Rectal  deferred Extremities  No bilateral cyanosis, clubbing or edema.  Thereasa Solo, MD  11/22/2018, 2:29 PM

## 2018-11-22 NOTE — Patient Instructions (Signed)
You are cleared to resume all physical activity on 11/26/18.  Please wait until after 12/27/18 to resume intercourse.  Please continue to see Dr Radene Knee annually for wellness care.

## 2018-12-05 ENCOUNTER — Encounter: Payer: Self-pay | Admitting: Gynecologic Oncology

## 2019-08-14 ENCOUNTER — Encounter: Payer: Self-pay | Admitting: Gynecology

## 2020-02-07 ENCOUNTER — Ambulatory Visit: Payer: BC Managed Care – PPO | Attending: Internal Medicine

## 2020-02-07 DIAGNOSIS — Z23 Encounter for immunization: Secondary | ICD-10-CM

## 2020-02-07 NOTE — Progress Notes (Signed)
   Covid-19 Vaccination Clinic  Name:  Brandy Henson    MRN: [id number] DOB: July 13, 1956  02/07/2020  [name] was observed post Covid-19 immunization for 15 minutes without incident. She was provided with Vaccine Information Sheet and instruction to access the V-Safe system.   [name] was instructed to call 911 with any severe reactions post vaccine: Marland Kitchen Difficulty breathing  . Swelling of face and throat  . A fast heartbeat  . A bad rash all over body  . Dizziness and weakness   Immunizations Administered    Name Date Dose VIS Date Route   Pfizer COVID-19 Vaccine 02/07/2020 11:21 AM 0.3 mL 10/26/2019 Intramuscular   Manufacturer: [place]   Lot: [id number]   [place]: [id number]

## 2020-02-25 ENCOUNTER — Telehealth: Payer: Self-pay

## 2020-02-25 NOTE — Telephone Encounter (Signed)
Pt began with vaginal bleeding this am.  She is experiencing extreme discomfort in lower abdomen.  She has had to urinate frequently.  She has seen blood and clots in the commode. She is not using a pad as she does not have any. She has an appointment Dr. Billee Cashing Comb at 3 pm. today Told Ms Dieker that Dr. Dwaine Deter will exam her and if he needs to re refer her to Dr. Denman George he will call the office.

## 2020-03-03 ENCOUNTER — Ambulatory Visit: Payer: BC Managed Care – PPO | Attending: Internal Medicine

## 2020-03-03 DIAGNOSIS — Z23 Encounter for immunization: Secondary | ICD-10-CM

## 2020-03-03 NOTE — Progress Notes (Signed)
   Covid-19 Vaccination Clinic  Name:  Brandy Henson    MRN: [id number] DOB: November 01, 1956  03/03/2020  [name] was observed post Covid-19 immunization for 15 minutes without incident. She was provided with Vaccine Information Sheet and instruction to access the V-Safe system.   [name] was instructed to call 911 with any severe reactions post vaccine: Marland Kitchen Difficulty breathing  . Swelling of face and throat  . A fast heartbeat  . A bad rash all over body  . Dizziness and weakness   Immunizations Administered    Name Date Dose VIS Date Route   Pfizer COVID-19 Vaccine 03/03/2020 10:44 AM 0.3 mL 01/09/2019 Intramuscular   Manufacturer: [place]   Lot: [id number]   [place]: [id number]

## 2022-04-09 ENCOUNTER — Emergency Department (HOSPITAL_BASED_OUTPATIENT_CLINIC_OR_DEPARTMENT_OTHER)
Admission: EM | Admit: 2022-04-09 | Discharge: 2022-04-09 | Disposition: A | Discharge status: Home / Self Care | Payer: 59 | Attending: Emergency Medicine | Admitting: Emergency Medicine

## 2022-04-09 ENCOUNTER — Ambulatory Visit (HOSPITAL_COMMUNITY)
Admission: EM | Admit: 2022-04-09 | Discharge: 2022-04-09 | Disposition: A | Discharge status: Home / Self Care | Payer: 59

## 2022-04-09 ENCOUNTER — Emergency Department (HOSPITAL_BASED_OUTPATIENT_CLINIC_OR_DEPARTMENT_OTHER): Payer: 59

## 2022-04-09 ENCOUNTER — Other Ambulatory Visit: Payer: Self-pay

## 2022-04-09 DIAGNOSIS — S6991XA Unspecified injury of right wrist, hand and finger(s), initial encounter: Secondary | ICD-10-CM | POA: Diagnosis present

## 2022-04-09 DIAGNOSIS — S60031A Contusion of right middle finger without damage to nail, initial encounter: Secondary | ICD-10-CM | POA: Diagnosis not present

## 2022-04-09 DIAGNOSIS — Z23 Encounter for immunization: Secondary | ICD-10-CM | POA: Diagnosis not present

## 2022-04-09 MED ORDER — TETANUS-DIPHTH-ACELL PERTUSSIS 5-2.5-18.5 LF-MCG/0.5 IM SUSY
PREFILLED_SYRINGE | INTRAMUSCULAR | Status: AC
  Administered 2022-04-09: 0.5 mL via INTRAMUSCULAR
  Filled 2022-04-09: qty 0.5

## 2022-04-09 MED ORDER — DOXYCYCLINE HYCLATE 100 MG PO CAPS: 100.0000 mg | ORAL_CAPSULE | Freq: Two times a day (BID) | ORAL | 0 refills | Status: AC

## 2022-04-09 MED ORDER — TETANUS-DIPHTH-ACELL PERTUSSIS 5-2.5-18.5 LF-MCG/0.5 IM SUSY: 0.5000 mL | PREFILLED_SYRINGE | Freq: Once | INTRAMUSCULAR | Status: AC

## 2022-04-09 MED ORDER — LIDOCAINE HCL (PF) 1 % IJ SOLN
10.0000 mL | Freq: Once | INTRAMUSCULAR | Status: AC
  Administered 2022-04-09: 10 mL
  Filled 2022-04-09: qty 10

## 2022-04-09 MED ORDER — MUPIROCIN CALCIUM 2 % EX CREA: 1.0000 "application " | TOPICAL_CREAM | Freq: Two times a day (BID) | CUTANEOUS | 1 refills | Status: AC

## 2022-04-09 NOTE — ED Provider Notes (Signed)
Pt seen in triage. Alert and oriented here and in NARD. Exam of finger shows an avulsion injury of tissue from her fingerpad on the right finger, with ? Bony injury.   I asked her to proceed to the ER for urgent evaluation/tx and she and her husband are agreeable.   Barrett Henle, MD 04/09/22 1240
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
## 2022-04-09 NOTE — Discharge Instructions (Addendum)
You were seen in the ER for evaluation of your right finger injury. There was no fracture to the anger.  You likely does have a deep wound.  Please make sure that you are cleaning the area daily with Dial soap and water.  You can apply the prescribed ointment I prescribed you to the area and cover with a nonadherent bandage.  Please make sure you are not soaking your hand in any water such as dishes, pools, Jacuzzis, ponds, rivers, etc. this wound will likely take longer to heal given the surface area.  Since the wound was dirty, I have prescribed doxycycline to take twice daily for the next 7 days.  If you have any worsening pain, inability to move your finger, red streaking, or fever, please return to the nearest emergency department for evaluation.  Contact a health care provider if: You received a tetanus shot and you have swelling, severe pain, redness, or bleeding at the injection site. Your pain is not controlled with medicine. You have any of these signs of infection: More redness, swelling, or pain around the wound. Fluid or blood coming from the wound. Warmth coming from the wound. A fever or chills. You are nauseous or you vomit. You are dizzy. You have a new rash or hardness around the wound. Get help right away if: You have a red streak of skin near the area around your wound. Pus or a bad smell coming from the wound. Your wound has been closed with staples, sutures, skin glue, or adhesive strips and it begins to open up and separate. Your wound is bleeding, and the bleeding does not stop with gentle pressure. These symptoms may represent a serious problem that is an emergency. Do not wait to see if the symptoms will go away. Get medical help right away. Call your local emergency services (911 in the [place] Do not drive yourself to the hospital.
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
